# Patient Record
Sex: Female | Born: 1950 | Hispanic: No | Marital: Married | Smoking: Never smoker
Health system: Southern US, Community
[De-identification: ages and names within clinical notes are randomized; demographics above are authoritative.]

## PROBLEM LIST (undated history)

## (undated) DIAGNOSIS — I10 Essential (primary) hypertension: Secondary | ICD-10-CM

## (undated) DIAGNOSIS — D649 Anemia, unspecified: Secondary | ICD-10-CM

## (undated) HISTORY — DX: Essential (primary) hypertension: I10

## (undated) HISTORY — DX: Anemia, unspecified: D64.9

---

## 2013-03-18 ENCOUNTER — Other Ambulatory Visit: Payer: Self-pay | Admitting: Cardiovascular Disease

## 2013-03-18 MED ORDER — PREDNISONE 20 MG PO TABS: 20.0000 mg | ORAL_TABLET | Freq: Every day | ORAL | Status: DC

## 2013-05-03 ENCOUNTER — Other Ambulatory Visit: Payer: Self-pay | Admitting: Cardiovascular Disease

## 2013-05-03 MED ORDER — PREDNISONE 2.5 MG PO TABS: 2.5000 mg | ORAL_TABLET | Freq: Every day | ORAL | Status: DC

## 2013-05-03 MED ORDER — AMOXICILLIN 500 MG PO TABS: 500.0000 mg | ORAL_TABLET | Freq: Three times a day (TID) | ORAL | Status: DC

## 2014-03-11 ENCOUNTER — Other Ambulatory Visit: Payer: Self-pay | Admitting: Cardiovascular Disease

## 2014-03-11 MED ORDER — PREDNISONE 20 MG PO TABS: 20.0000 mg | ORAL_TABLET | Freq: Every day | ORAL | Status: DC

## 2014-05-02 ENCOUNTER — Other Ambulatory Visit: Payer: Self-pay | Admitting: Cardiovascular Disease

## 2014-05-02 MED ORDER — PREDNISONE 2.5 MG PO TABS: 2.5000 mg | ORAL_TABLET | Freq: Every day | ORAL | Status: DC

## 2014-07-30 ENCOUNTER — Other Ambulatory Visit: Payer: Self-pay | Admitting: Cardiovascular Disease

## 2014-07-30 MED ORDER — AMOXICILLIN 500 MG PO TABS: 500.0000 mg | ORAL_TABLET | Freq: Three times a day (TID) | ORAL | Status: DC

## 2015-04-08 ENCOUNTER — Other Ambulatory Visit: Payer: Self-pay | Admitting: Cardiovascular Disease

## 2015-04-08 MED ORDER — AMOXICILLIN 500 MG PO TABS: 500.0000 mg | ORAL_TABLET | Freq: Three times a day (TID) | ORAL | Status: DC

## 2015-04-13 ENCOUNTER — Other Ambulatory Visit: Payer: Self-pay | Admitting: Cardiovascular Disease

## 2015-04-13 MED ORDER — PREDNISONE 20 MG PO TABS: 20.0000 mg | ORAL_TABLET | Freq: Every day | ORAL | Status: DC

## 2015-05-23 ENCOUNTER — Other Ambulatory Visit: Payer: Self-pay | Admitting: Cardiovascular Disease

## 2015-05-23 MED ORDER — LISINOPRIL-HYDROCHLOROTHIAZIDE 20-25 MG PO TABS: 1.0000 | ORAL_TABLET | Freq: Every day | ORAL | Status: DC

## 2015-05-23 MED ORDER — CARVEDILOL 25 MG PO TABS: 25.0000 mg | ORAL_TABLET | Freq: Two times a day (BID) | ORAL | Status: DC

## 2015-06-25 ENCOUNTER — Other Ambulatory Visit: Payer: Self-pay | Admitting: Cardiovascular Disease

## 2015-06-25 MED ORDER — AMOXICILLIN 500 MG PO CAPS: 500.0000 mg | ORAL_CAPSULE | Freq: Three times a day (TID) | ORAL | Status: DC

## 2015-10-13 ENCOUNTER — Other Ambulatory Visit: Payer: Self-pay | Admitting: Nurse Practitioner

## 2015-10-13 ENCOUNTER — Ambulatory Visit
Admission: RE | Admit: 2015-10-13 | Discharge: 2015-10-13 | Disposition: A | Discharge status: Home / Self Care | Payer: Medicaid Other | Source: Ambulatory Visit | Attending: Nurse Practitioner | Admitting: Nurse Practitioner

## 2015-10-13 DIAGNOSIS — M25511 Pain in right shoulder: Secondary | ICD-10-CM | POA: Insufficient documentation

## 2015-10-13 DIAGNOSIS — M542 Cervicalgia: Secondary | ICD-10-CM

## 2015-10-13 DIAGNOSIS — M50322 Other cervical disc degeneration at C5-C6 level: Secondary | ICD-10-CM | POA: Insufficient documentation

## 2015-11-20 ENCOUNTER — Other Ambulatory Visit: Payer: Self-pay | Admitting: Cardiovascular Disease

## 2015-11-20 MED ORDER — LISINOPRIL-HYDROCHLOROTHIAZIDE 20-25 MG PO TABS: 1.0000 | ORAL_TABLET | Freq: Every day | ORAL | Status: DC

## 2016-03-01 ENCOUNTER — Other Ambulatory Visit: Payer: Self-pay | Admitting: Cardiovascular Disease

## 2016-03-01 MED ORDER — PREDNISONE 20 MG PO TABS: 20.0000 mg | ORAL_TABLET | Freq: Every day | ORAL | 0 refills | Status: DC

## 2016-03-18 ENCOUNTER — Other Ambulatory Visit: Payer: Self-pay | Admitting: Internal Medicine

## 2016-03-18 DIAGNOSIS — M5000 Cervical disc disorder with myelopathy, unspecified cervical region: Secondary | ICD-10-CM

## 2016-03-18 DIAGNOSIS — E2839 Other primary ovarian failure: Secondary | ICD-10-CM

## 2016-03-18 DIAGNOSIS — M25511 Pain in right shoulder: Secondary | ICD-10-CM

## 2016-03-20 ENCOUNTER — Ambulatory Visit: Payer: Medicaid Other

## 2016-03-25 ENCOUNTER — Ambulatory Visit: Admission: RE | Admit: 2016-03-25 | Payer: Medicaid Other | Source: Ambulatory Visit

## 2016-04-04 ENCOUNTER — Ambulatory Visit
Admission: RE | Admit: 2016-04-04 | Discharge: 2016-04-04 | Disposition: A | Discharge status: Home / Self Care | Payer: Medicaid Other | Source: Ambulatory Visit | Attending: Internal Medicine | Admitting: Internal Medicine

## 2016-04-04 DIAGNOSIS — M75111 Incomplete rotator cuff tear or rupture of right shoulder, not specified as traumatic: Secondary | ICD-10-CM | POA: Insufficient documentation

## 2016-04-04 DIAGNOSIS — M4802 Spinal stenosis, cervical region: Secondary | ICD-10-CM | POA: Diagnosis not present

## 2016-04-04 DIAGNOSIS — M50322 Other cervical disc degeneration at C5-C6 level: Secondary | ICD-10-CM | POA: Insufficient documentation

## 2016-04-04 DIAGNOSIS — M7581 Other shoulder lesions, right shoulder: Secondary | ICD-10-CM | POA: Insufficient documentation

## 2016-04-04 DIAGNOSIS — M25511 Pain in right shoulder: Secondary | ICD-10-CM | POA: Diagnosis present

## 2016-04-04 DIAGNOSIS — M5000 Cervical disc disorder with myelopathy, unspecified cervical region: Secondary | ICD-10-CM

## 2016-04-04 IMAGING — MR MR SHOULDER*R* W/O CM
5 series · 40 of 40 positions shown · non-contrast
Comparison: None.

CLINICAL DATA: Right shoulder pain

EXAM:
MRI OF THE RIGHT SHOULDER WITHOUT CONTRAST
TECHNIQUE: Multiplanar, multisequence MR imaging of the shoulder was performed.
No intravenous contrast was administered.

[Series 3: T2 fat-sat · axial · 4.0mm · 0.55mm/px · z∈[-15,+73]mm · 9 of 21 slices shown (1 of 3)]
[im 1/21]
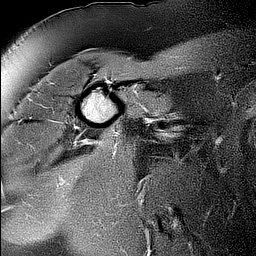
[im 3/21]
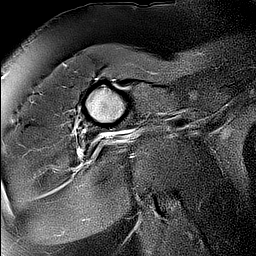
[im 6/21]
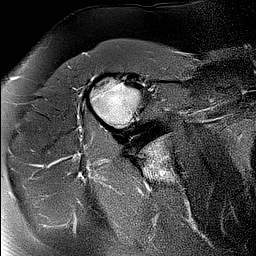
[im 8/21]
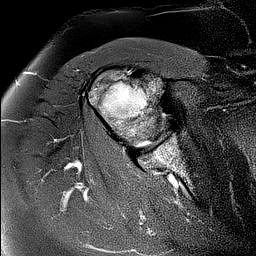
[im 11/21]
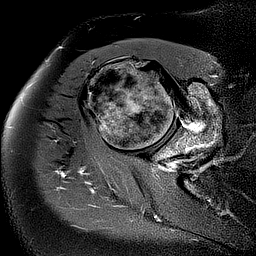
[im 13/21]
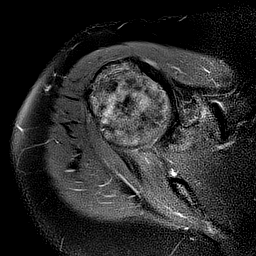
[im 16/21]
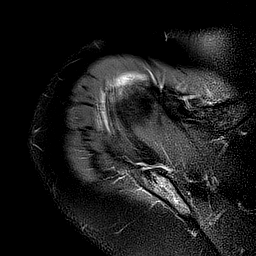
[im 18/21]
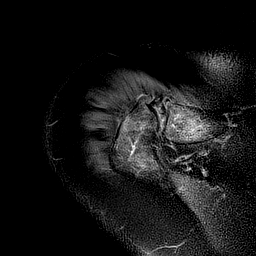
[im 21/21]
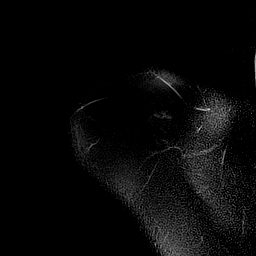

[Series 4: T2 fat-sat · oblique · 4.0mm · 0.62mm/px · 8 of 18 slices shown (2 of 3)]
[im 1/18]
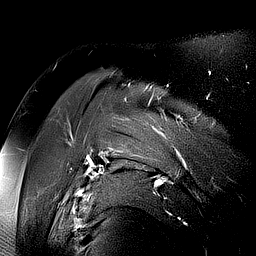
[im 3/18]
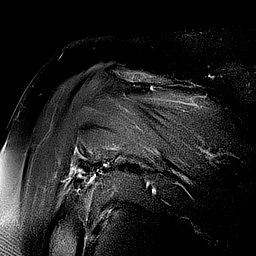
[im 5/18]
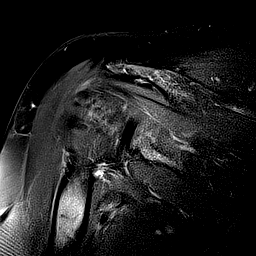
[im 8/18]
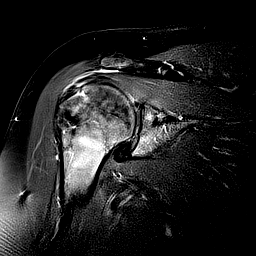
[im 10/18]
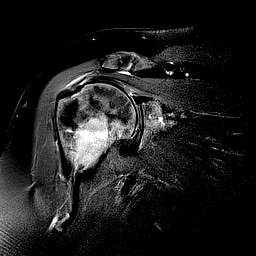
[im 13/18]
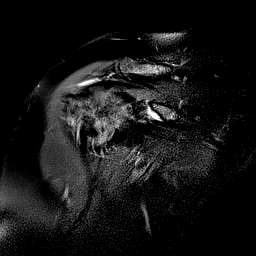
[im 15/18]
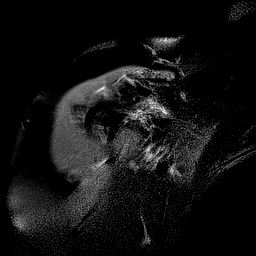
[im 18/18]
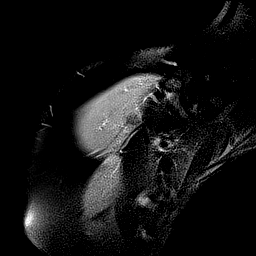

[Series 5: PD · oblique · 4.0mm · 0.62mm/px · 7 of 18 slices shown]
[im 1/18]
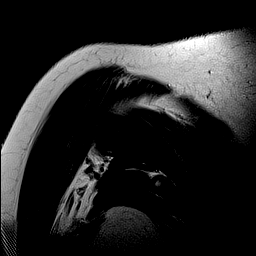
[im 3/18]
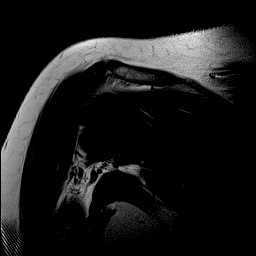
[im 6/18]
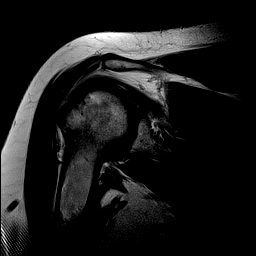
[im 9/18]
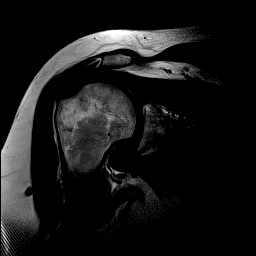
[im 12/18]
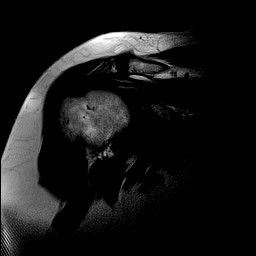
[im 15/18]
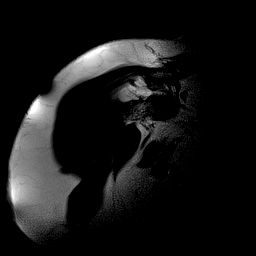
[im 18/18]
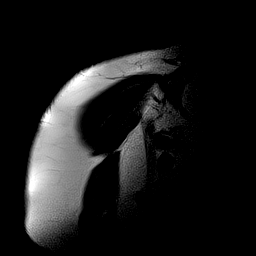

[Series 100: T1 · sagittal · 4.0mm · 0.54mm/px · 8 of 21 slices shown]
[im 1/21]
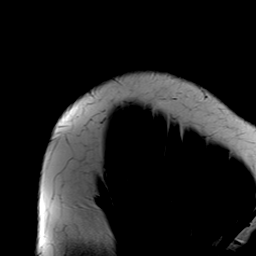
[im 3/21]
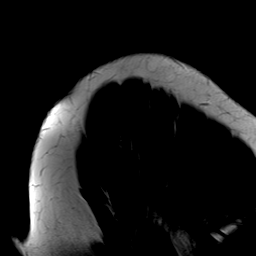
[im 6/21]
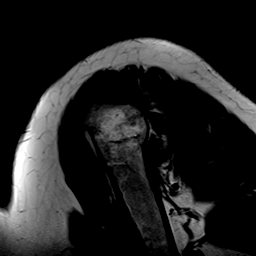
[im 9/21]
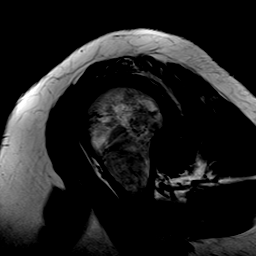
[im 12/21]
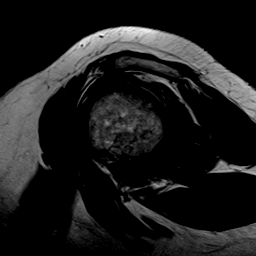
[im 15/21]
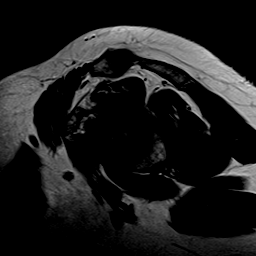
[im 18/21]
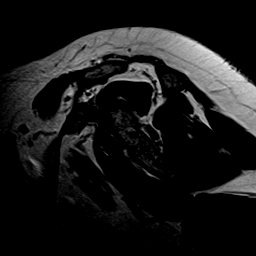
[im 21/21]
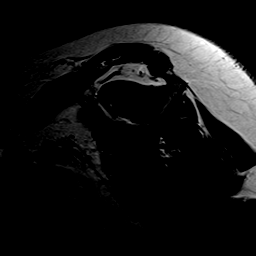

[Series 101: T2 fat-sat · sagittal · 4.0mm · 0.54mm/px · 8 of 21 slices shown (3 of 3)]
[im 1/21]
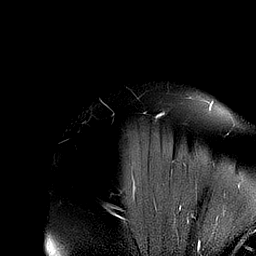
[im 3/21]
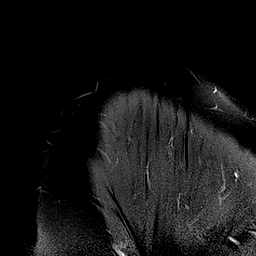
[im 6/21]
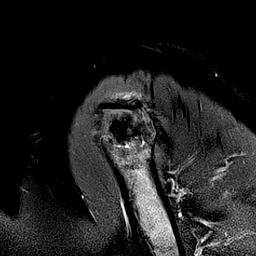
[im 9/21]
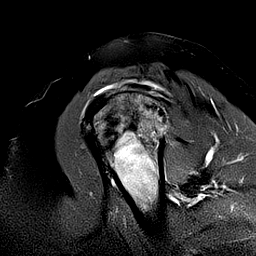
[im 12/21]
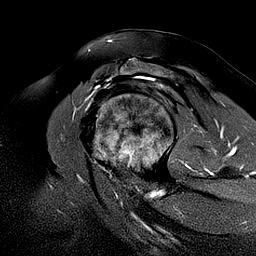
[im 15/21]
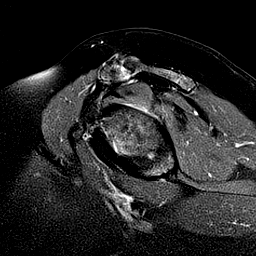
[im 18/21]
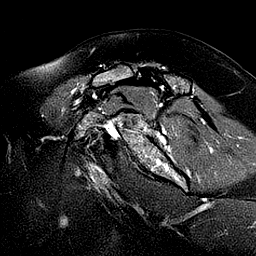
[im 21/21]
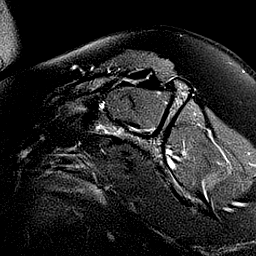

[40 of 40 positions shown; findings below may reference images not displayed]

FINDINGS: Rotator cuff: Moderate tendinosis of the supraspinatus tendon with a
partial-thickness bursal surface tear of the anterior fibers. Mild
tendinosis of the infraspinatus tendon. Teres minor tendon is
intact. Subscapularis tendon is intact.

Muscles: No atrophy or fatty replacement of nor abnormal signal
within, the muscles of the rotator cuff.

Biceps long head: Mild tendinosis of the intraarticular portion of
the long head of the biceps tendon.

Acromioclavicular Joint: Mild degenerative changes of the
acromioclavicular joint. Type I acromion. Trace subacromial/
subdeltoid bursal fluid.

Glenohumeral Joint: No joint effusion. Mild partial thickness
cartilage loss.

Labrum: Grossly intact, but evaluation is limited by lack of
intraarticular fluid.

Bones:  No marrow signal abnormality.  No fracture or dislocation.

Other: No fluid collection or hematoma.
IMPRESSION: 1. Moderate tendinosis of the supraspinatus tendon with a
partial-thickness bursal surface tear of the anterior fibers.
2. Mild tendinosis of the infraspinatus tendon.
3. Mild tendinosis of the intraarticular portion of the long head of
the biceps tendon.

## 2016-05-06 ENCOUNTER — Ambulatory Visit
Admission: RE | Admit: 2016-05-06 | Discharge: 2016-05-06 | Disposition: A | Discharge status: Home / Self Care | Payer: Medicaid Other | Source: Ambulatory Visit | Attending: Internal Medicine | Admitting: Internal Medicine

## 2016-05-06 DIAGNOSIS — E2839 Other primary ovarian failure: Secondary | ICD-10-CM | POA: Insufficient documentation

## 2016-05-06 DIAGNOSIS — M8588 Other specified disorders of bone density and structure, other site: Secondary | ICD-10-CM | POA: Diagnosis not present

## 2017-04-28 ENCOUNTER — Other Ambulatory Visit: Payer: Self-pay | Admitting: Cardiovascular Disease

## 2017-04-28 MED ORDER — CARVEDILOL 6.25 MG PO TABS: 6.2500 mg | ORAL_TABLET | Freq: Two times a day (BID) | ORAL | 0 refills | Status: DC

## 2017-05-25 ENCOUNTER — Other Ambulatory Visit: Payer: Self-pay | Admitting: Cardiovascular Disease

## 2017-05-25 MED ORDER — CARVEDILOL 12.5 MG PO TABS: 12.5000 mg | ORAL_TABLET | Freq: Two times a day (BID) | ORAL | 2 refills | Status: DC

## 2017-08-19 ENCOUNTER — Other Ambulatory Visit: Payer: Self-pay | Admitting: Cardiovascular Disease

## 2017-08-19 MED ORDER — CARVEDILOL 12.5 MG PO TABS: 12.5000 mg | ORAL_TABLET | Freq: Two times a day (BID) | ORAL | 2 refills | Status: DC

## 2017-08-21 ENCOUNTER — Other Ambulatory Visit: Payer: Self-pay | Admitting: Cardiovascular Disease

## 2017-08-21 MED ORDER — AZITHROMYCIN 250 MG PO TABS: ORAL_TABLET | ORAL | 0 refills | Status: DC

## 2017-09-10 ENCOUNTER — Other Ambulatory Visit: Payer: Self-pay | Admitting: Cardiovascular Disease

## 2017-09-10 MED ORDER — PREDNISONE 20 MG PO TABS: 20.0000 mg | ORAL_TABLET | Freq: Every day | ORAL | 0 refills | Status: DC

## 2018-10-17 ENCOUNTER — Other Ambulatory Visit: Payer: Self-pay | Admitting: Cardiovascular Disease

## 2018-10-17 MED ORDER — CARVEDILOL 12.5 MG PO TABS: 12.5000 mg | ORAL_TABLET | Freq: Two times a day (BID) | ORAL | 3 refills | Status: DC

## 2019-09-23 ENCOUNTER — Telehealth: Payer: Self-pay

## 2019-09-23 ENCOUNTER — Other Ambulatory Visit: Payer: Self-pay

## 2019-09-23 ENCOUNTER — Ambulatory Visit: Payer: Medicaid Other | Admitting: Nurse Practitioner

## 2019-09-23 ENCOUNTER — Encounter: Payer: Self-pay | Admitting: Nurse Practitioner

## 2019-09-23 VITALS — BP 118/78 | HR 84 | Temp 97.9°F | Resp 16 | Ht 65.0 in | Wt 241.0 lb

## 2019-09-23 DIAGNOSIS — I10 Essential (primary) hypertension: Secondary | ICD-10-CM | POA: Diagnosis not present

## 2019-09-23 DIAGNOSIS — D485 Neoplasm of uncertain behavior of skin: Secondary | ICD-10-CM

## 2019-09-23 DIAGNOSIS — Z1211 Encounter for screening for malignant neoplasm of colon: Secondary | ICD-10-CM

## 2019-09-23 DIAGNOSIS — D569 Thalassemia, unspecified: Secondary | ICD-10-CM

## 2019-09-23 NOTE — Telephone Encounter (Signed)
Faxed cologuard 

## 2019-09-23 NOTE — Progress Notes (Signed)
[facility] [place], [place] [address]  Internal MEDICINE  Office Visit Note  Patient Name: Faith Hernandez  [id number]  [id number]  Date of Service: 09/27/2019  Chief Complaint  Patient presents with  . Follow-up    skin Lesion right side of her face   . Hypertension  . Quality Metric Gaps    cologuard  and pneumonia    The patient is here for follow up visit. She presents with her son who is translating for [place]. She complains of small lesion to right side of the face. Has been present for about a month. She state that it seems to be getting smaller. Does not hurt or itch. There is no bleeding or scabbing present. Concern is that there may be squamous cell carcinoma present.  She has no other concerns or complaints. Blood pressure is well managed. 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 She is doe to have routine, fasting labs done. She prefers to do cologuard for colon cancer screening. She has had both COVID 19 vaccines. Patient's son will call back to give [place] dates so these can be recorded in her immunization record.       Current Medication: Outpatient Encounter Medications as of 09/23/2019  Medication Sig  . pneumococcal 13-valent conjugate vaccine (PREVNAR 13) SUSP injection Inject 0.5 mLs into the muscle tomorrow at 10 am.  . [DISCONTINUED] amoxicillin (AMOXIL) 500 MG capsule Take 1 capsule (500 mg total) by mouth 3 (three) times daily.  . [DISCONTINUED] azithromycin (ZITHROMAX) 250 MG tablet Take 2 tablet first day then 1 tablet daily.  . [DISCONTINUED] predniSONE (DELTASONE) 20 MG tablet Take 1 tablet (20 mg total) by mouth daily.  . carvedilol (COREG) 12.5 MG tablet Take 1 tablet (12.5 mg total) by mouth 2 (two) times daily.   No facility-administered encounter medications on file as of 09/23/2019.    Surgical History: History reviewed. No pertinent surgical history.  Medical History: Past Medical History:    Diagnosis Date  . Anemia   . Hypertension     Family History: Family History  Problem Relation Age of Onset  . Hypertension Brother   . Hyperlipidemia Brother     Social History   Socioeconomic History  . Marital status: Married    Spouse name: Not on file  . Number of children: Not on file  . Years of education: Not on file  . Highest education level: Not on file  Occupational History  . Not on file  Tobacco Use  . Smoking status: Never Smoker  . Smokeless tobacco: Never Used  Substance and Sexual Activity  . Alcohol use: Not on file  . Drug use: Not on file  . Sexual activity: Not on file  Other Topics Concern  . Not on file  Social History Narrative  . Not on file   Social Determinants of Health   Financial Resource Strain:   . Difficulty of Paying Living Expenses:   Food Insecurity:   . Worried About Charity fundraiser in the Last Year:   . Arboriculturist in the Last Year:   Transportation Needs:   . Film/video editor (Medical):   Marland Kitchen Lack of Transportation (Non-Medical):   Physical Activity:   . Days of Exercise per Week:   . Minutes of Exercise per Session:   Stress:   . Feeling of Stress :   Social Connections:   . Frequency of Communication with Friends and Family:   .  Frequency of Social Gatherings with Friends and Family:   . Attends Religious Services:   . Active Member of Clubs or Organizations:   . Attends Archivist Meetings:   Marland Kitchen Marital Status:   Intimate Partner Violence:   . Fear of Current or Ex-Partner:   . Emotionally Abused:   Marland Kitchen Physically Abused:   . Sexually Abused:       Review of Systems  Constitutional: Negative for chills, fatigue and unexpected weight change.  HENT: Positive for postnasal drip. Negative for congestion, rhinorrhea, sneezing and sore throat.   Respiratory: Negative for cough, chest tightness and shortness of breath.   Cardiovascular: Negative for chest pain and palpitations.   Gastrointestinal: Negative for abdominal pain, constipation, diarrhea, nausea and vomiting.  Endocrine: Negative for cold intolerance, heat intolerance, polydipsia and polyuria.  Musculoskeletal: Negative for arthralgias, back pain, joint swelling and neck pain.  Skin: Negative for rash.       Skin lesion to right cheek which is itchy and has been present for a few weeks.   Neurological: Negative.  Negative for tremors and numbness.  Hematological: Negative for adenopathy. Does not bruise/bleed easily.  Psychiatric/Behavioral: Negative for behavioral problems (Depression), sleep disturbance and suicidal ideas. The patient is not nervous/anxious.     Today's Vitals   09/23/19 1343  BP: 118/78  Pulse: 84  Resp: 16  Temp: 97.9 F (36.6 C)  SpO2: 98%  Weight: 241 lb (109.3 kg)  Height: 5\' 5"  (1.651 m)   Body mass index is 40.1 kg/m.  Physical Exam Vitals and nursing note reviewed.  Constitutional:      General: She is not in acute distress.    Appearance: Normal appearance. She is well-developed. She is not diaphoretic.  HENT:     Head: Normocephalic and atraumatic.     Nose: Nose normal.     Mouth/Throat:     Pharynx: No oropharyngeal exudate.  Eyes:     Pupils: Pupils are equal, round, and reactive to light.  Neck:     Thyroid: No thyromegaly.     Vascular: No carotid bruit or JVD.     Trachea: No tracheal deviation.  Cardiovascular:     Rate and Rhythm: Normal rate and regular rhythm.     Heart sounds: Normal heart sounds. No murmur. No friction rub. No gallop.   Pulmonary:     Effort: Pulmonary effort is normal. No respiratory distress.     Breath sounds: Normal breath sounds. No wheezing or rales.  Chest:     Chest wall: No tenderness.  Abdominal:     Palpations: Abdomen is soft.  Musculoskeletal:        General: Normal range of motion.     Cervical back: Normal range of motion and neck supple.  Lymphadenopathy:     Cervical: No cervical adenopathy.  Skin:     General: Skin is warm and dry.       Neurological:     Mental Status: She is alert and oriented to person, place, and time.     Cranial Nerves: No cranial nerve deficit.  Psychiatric:        Behavior: Behavior normal.        Thought Content: Thought content normal.        Judgment: Judgment normal.   Assessment/Plan: 1. Essential hypertension Stable. Continue bp medication as prescribed   2. Thalassemia, unspecified type Check labs for surveillance.   3. Neoplasm of uncertain behavior of skin of face Refer to  dermatology for further evaluation.  - Ambulatory referral to Dermatology  4. Screening for colon cancer Order for colocguard to be sent.   General Counseling: Jaynie verbalizes understanding of the findings of todays visit and agrees with plan of treatment. I have discussed any further diagnostic evaluation that may be needed or ordered today. We also reviewed her medications today. she has been encouraged to call the office with any questions or concerns that should arise related to todays visit.  This patient was seen by Leretha Pol FNP Collaboration with Dr Lavera Guise as a part of collaborative care agreement  Orders Placed This Encounter  Procedures  . Ambulatory referral to Dermatology      Total time spent: 30 Minutes  Time spent includes review of chart, medications, test results, and follow up plan with the patient.      Dr Lavera Guise Internal medicine

## 2019-09-27 DIAGNOSIS — I10 Essential (primary) hypertension: Secondary | ICD-10-CM | POA: Insufficient documentation

## 2019-09-27 DIAGNOSIS — Z1211 Encounter for screening for malignant neoplasm of colon: Secondary | ICD-10-CM | POA: Insufficient documentation

## 2019-09-27 DIAGNOSIS — D485 Neoplasm of uncertain behavior of skin: Secondary | ICD-10-CM | POA: Insufficient documentation

## 2019-09-27 DIAGNOSIS — D569 Thalassemia, unspecified: Secondary | ICD-10-CM | POA: Insufficient documentation

## 2019-10-21 LAB — COLOGUARD: COLOGUARD: NEGATIVE

## 2019-11-02 ENCOUNTER — Other Ambulatory Visit: Payer: Self-pay | Admitting: Nurse Practitioner

## 2019-11-02 DIAGNOSIS — I10 Essential (primary) hypertension: Secondary | ICD-10-CM

## 2019-11-02 MED ORDER — CARVEDILOL 12.5 MG PO TABS: 12.5000 mg | ORAL_TABLET | Freq: Two times a day (BID) | ORAL | 3 refills | Status: DC

## 2019-11-02 NOTE — Progress Notes (Signed)
Renewed carvedilol and sent to [facility] [address].

## 2020-01-05 ENCOUNTER — Ambulatory Visit: Payer: Self-pay | Admitting: Dermatology

## 2020-02-12 ENCOUNTER — Other Ambulatory Visit: Payer: Self-pay | Admitting: Cardiovascular Disease

## 2020-02-12 MED ORDER — PREDNISONE 20 MG PO TABS: 20.0000 mg | ORAL_TABLET | Freq: Every day | ORAL | 0 refills | Status: DC

## 2020-11-08 ENCOUNTER — Other Ambulatory Visit: Payer: Self-pay | Admitting: Nurse Practitioner

## 2020-11-08 DIAGNOSIS — I10 Essential (primary) hypertension: Secondary | ICD-10-CM

## 2020-11-20 ENCOUNTER — Other Ambulatory Visit: Payer: Self-pay | Admitting: Internal Medicine

## 2020-11-20 DIAGNOSIS — I10 Essential (primary) hypertension: Secondary | ICD-10-CM

## 2020-11-20 MED ORDER — CARVEDILOL 12.5 MG PO TABS
12.5000 mg | ORAL_TABLET | Freq: Two times a day (BID) | ORAL | 3 refills | Status: DC
Start: 2020-11-20 — End: 2021-12-03

## 2021-12-03 ENCOUNTER — Other Ambulatory Visit: Payer: Self-pay | Admitting: Internal Medicine

## 2021-12-03 ENCOUNTER — Telehealth: Payer: Self-pay

## 2021-12-03 DIAGNOSIS — I10 Essential (primary) hypertension: Secondary | ICD-10-CM

## 2021-12-03 MED ORDER — CARVEDILOL 12.5 MG PO TABS: 12.5000 mg | ORAL_TABLET | Freq: Two times a day (BID) | ORAL | 3 refills | Status: DC

## 2021-12-06 NOTE — Telephone Encounter (Signed)
error 

## 2022-01-10 ENCOUNTER — Telehealth: Payer: Self-pay

## 2022-01-10 NOTE — Telephone Encounter (Signed)
Left vm to confirm 01/17/22 appointment-Toni

## 2022-01-17 ENCOUNTER — Ambulatory Visit: Payer: Medicaid Other | Admitting: Physician Assistant

## 2022-01-17 DIAGNOSIS — R946 Abnormal results of thyroid function studies: Secondary | ICD-10-CM

## 2022-01-17 DIAGNOSIS — E538 Deficiency of other specified B group vitamins: Secondary | ICD-10-CM

## 2022-01-17 DIAGNOSIS — I10 Essential (primary) hypertension: Secondary | ICD-10-CM | POA: Diagnosis not present

## 2022-01-17 DIAGNOSIS — E782 Mixed hyperlipidemia: Secondary | ICD-10-CM

## 2022-01-17 DIAGNOSIS — E559 Vitamin D deficiency, unspecified: Secondary | ICD-10-CM

## 2022-01-17 DIAGNOSIS — R5383 Other fatigue: Secondary | ICD-10-CM

## 2022-01-17 NOTE — Progress Notes (Signed)
[facility] [place], [place] [address]  Internal MEDICINE  Office Visit Note  Patient Name: Faith Hernandez  [id number]  [id number]  Date of Service: 01/24/2022  Chief Complaint  Patient presents with   Hypertension   Quality Metric Gaps    AWV    HPI Pt is here for routine follow up with her son -She is taking carvedilol as well as an OTC vitamin D supplement -Checks BP at home sometimes and has been good -She has no acute concerns today, just needs refills and would like to be set up for annual wellness with labs -Will go ahead and order labs to be done while fasting  Current Medication: Outpatient Encounter Medications as of 01/17/2022  Medication Sig   carvedilol (COREG) 12.5 MG tablet Take 1 tablet (12.5 mg total) by mouth 2 (two) times daily.   [DISCONTINUED] pneumococcal 13-valent conjugate vaccine (PREVNAR 13) SUSP injection Inject 0.5 mLs into the muscle tomorrow at 10 am.   [DISCONTINUED] predniSONE (DELTASONE) 20 MG tablet Take 1 tablet (20 mg total) by mouth daily with breakfast.   No facility-administered encounter medications on file as of 01/17/2022.    Surgical History: No past surgical history on file.  Medical History: Past Medical History:  Diagnosis Date   Anemia    Hypertension     Family History: Family History  Problem Relation Age of Onset   Hypertension Brother    Hyperlipidemia Brother     Social History   Socioeconomic History   Marital status: Married    Spouse name: Not on file   Number of children: Not on file   Years of education: Not on file   Highest education level: Not on file  Occupational History   Not on file  Tobacco Use   Smoking status: Never   Smokeless tobacco: Never  Substance and Sexual Activity   Alcohol use: Not on file   Drug use: Not on file   Sexual activity: Not on file  Other Topics Concern   Not on file  Social History Narrative   Not on file   Social Determinants of  Health   Financial Resource Strain: Not on file  Food Insecurity: Not on file  Transportation Needs: Not on file  Physical Activity: Not on file  Stress: Not on file  Social Connections: Not on file  Intimate Partner Violence: Not on file      Review of Systems  Constitutional:  Negative for chills, fatigue and unexpected weight change.  HENT:  Negative for congestion, rhinorrhea, sneezing and sore throat.   Eyes:  Negative for redness.  Respiratory:  Negative for cough, chest tightness and shortness of breath.   Cardiovascular:  Negative for chest pain and palpitations.  Gastrointestinal:  Negative for abdominal pain, constipation, diarrhea, nausea and vomiting.  Genitourinary:  Negative for dysuria and frequency.  Musculoskeletal:  Negative for arthralgias, back pain, joint swelling and neck pain.  Skin:  Negative for rash.  Neurological: Negative.  Negative for tremors and numbness.  Hematological:  Negative for adenopathy. Does not bruise/bleed easily.  Psychiatric/Behavioral:  Negative for behavioral problems (Depression), sleep disturbance and suicidal ideas. The patient is not nervous/anxious.     Vital Signs: BP 132/84   Pulse 73   Temp 97.8 F (36.6 C)   Resp 16   Ht '5\' 5"'$  (1.651 m)   Wt 243 lb (110.2 kg)   SpO2 97%   BMI 40.44 kg/m    Physical Exam Vitals and  nursing note reviewed.  Constitutional:      General: She is not in acute distress.    Appearance: Normal appearance. She is well-developed. She is not diaphoretic.  HENT:     Head: Normocephalic and atraumatic.     Mouth/Throat:     Pharynx: No oropharyngeal exudate.  Eyes:     Pupils: Pupils are equal, round, and reactive to light.  Neck:     Thyroid: No thyromegaly.     Vascular: No JVD.     Trachea: No tracheal deviation.  Cardiovascular:     Rate and Rhythm: Normal rate and regular rhythm.     Heart sounds: Normal heart sounds. No murmur heard.    No friction rub. No gallop.  Pulmonary:      Effort: Pulmonary effort is normal. No respiratory distress.     Breath sounds: No wheezing or rales.  Chest:     Chest wall: No tenderness.  Abdominal:     General: Bowel sounds are normal.     Palpations: Abdomen is soft.  Musculoskeletal:        General: Normal range of motion.     Cervical back: Normal range of motion and neck supple.  Lymphadenopathy:     Cervical: No cervical adenopathy.  Skin:    General: Skin is warm and dry.  Neurological:     Mental Status: She is alert and oriented to person, place, and time.     Cranial Nerves: No cranial nerve deficit.  Psychiatric:        Behavior: Behavior normal.        Thought Content: Thought content normal.        Judgment: Judgment normal.        Assessment/Plan: 1. Essential hypertension Stable, continue current medications  2. Vitamin D deficiency - VITAMIN D 25 Hydroxy (Vit-D Deficiency, Fractures)  3. B12 deficiency - B12 and Folate Panel  4. Abnormal thyroid exam - TSH + free T4  5. Mixed hyperlipidemia - Lipid Panel With LDL/HDL Ratio  6. Other fatigue - CBC w/Diff/Platelet - Comprehensive metabolic panel - Lipid Panel With LDL/HDL Ratio - TSH + free T4 - VITAMIN D 25 Hydroxy (Vit-D Deficiency, Fractures) - B12 and Folate Panel - Fe+TIBC+Fer   General Counseling: [name] verbalizes understanding of the findings of todays visit and agrees with plan of treatment. I have discussed any further diagnostic evaluation that may be needed or ordered today. We also reviewed her medications today. she has been encouraged to call the office with any questions or concerns that should arise related to todays visit.    Orders Placed This Encounter  Procedures   CBC w/Diff/Platelet   Comprehensive metabolic panel   Lipid Panel With LDL/HDL Ratio   TSH + free T4   VITAMIN D 25 Hydroxy (Vit-D Deficiency, Fractures)   B12 and Folate Panel   Fe+TIBC+Fer    No orders of the defined types were placed in this  encounter.   This patient was seen by Drema Dallas, PA-C in collaboration with Dr. Clayborn Bigness as a part of collaborative care agreement.   Total time spent:30 Minutes Time spent includes review of chart, medications, test results, and follow up plan with the patient.      Dr Lavera Guise Internal medicine

## 2022-01-19 LAB — LIPID PANEL WITH LDL/HDL RATIO
Cholesterol, Total: 154 mg/dL (ref 100–199)
HDL: 42 mg/dL (ref >39)
LDL Chol Calc (NIH): 85 mg/dL (ref 0–99)
LDL/HDL Ratio: 2 ratio (ref 0.0–3.2)
Triglycerides: 156 mg/dL — ABNORMAL HIGH (ref 0–149)
VLDL Cholesterol Cal: 27 mg/dL (ref 5–40)

## 2022-01-19 LAB — B12 AND FOLATE PANEL
Folate: 7.4 ng/mL (ref >3.0)
Vitamin B-12: 196 pg/mL — ABNORMAL LOW (ref 232–1245)

## 2022-01-19 LAB — TSH+FREE T4
Free T4: 1.13 ng/dL (ref 0.82–1.77)
TSH: 3.7 u[IU]/mL (ref 0.450–4.500)

## 2022-01-19 LAB — CBC WITH DIFFERENTIAL/PLATELET
Basophils Absolute: 0 10*3/uL (ref 0.0–0.2)
Basos: 0 %
EOS (ABSOLUTE): 0.2 10*3/uL (ref 0.0–0.4)
Eos: 3 %
Hematocrit: 35.3 % (ref 34.0–46.6)
Hemoglobin: 10.8 g/dL — ABNORMAL LOW (ref 11.1–15.9)
Immature Grans (Abs): 0 10*3/uL (ref 0.0–0.1)
Immature Granulocytes: 0 %
Lymphocytes Absolute: 2.4 10*3/uL (ref 0.7–3.1)
Lymphs: 33 %
MCH: 19.1 pg — ABNORMAL LOW (ref 26.6–33.0)
MCHC: 30.6 g/dL — ABNORMAL LOW (ref 31.5–35.7)
MCV: 63 fL — ABNORMAL LOW (ref 79–97)
Monocytes Absolute: 0.5 10*3/uL (ref 0.1–0.9)
Monocytes: 7 %
Neutrophils Absolute: 4.2 10*3/uL (ref 1.4–7.0)
Neutrophils: 57 %
Platelets: 226 10*3/uL (ref 150–450)
RBC: 5.65 x10E6/uL — ABNORMAL HIGH (ref 3.77–5.28)
RDW: 17.5 % — ABNORMAL HIGH (ref 11.7–15.4)
WBC: 7.4 10*3/uL (ref 3.4–10.8)

## 2022-01-19 LAB — COMPREHENSIVE METABOLIC PANEL
ALT: 23 IU/L (ref 0–32)
AST: 25 IU/L (ref 0–40)
Albumin/Globulin Ratio: 1.7 (ref 1.2–2.2)
Albumin: 4.5 g/dL (ref 3.8–4.8)
Alkaline Phosphatase: 77 IU/L (ref 44–121)
BUN/Creatinine Ratio: 20 (ref 12–28)
BUN: 14 mg/dL (ref 8–27)
Bilirubin Total: 1.7 mg/dL — ABNORMAL HIGH (ref 0.0–1.2)
CO2: 21 mmol/L (ref 20–29)
Calcium: 9.5 mg/dL (ref 8.7–10.3)
Chloride: 98 mmol/L (ref 96–106)
Creatinine, Ser: 0.7 mg/dL (ref 0.57–1.00)
Globulin, Total: 2.6 g/dL (ref 1.5–4.5)
Glucose: 102 mg/dL — ABNORMAL HIGH (ref 70–99)
Potassium: 4.5 mmol/L (ref 3.5–5.2)
Sodium: 135 mmol/L (ref 134–144)
Total Protein: 7.1 g/dL (ref 6.0–8.5)
eGFR: 92 mL/min/{1.73_m2} (ref >59)

## 2022-01-19 LAB — IRON,TIBC AND FERRITIN PANEL
Ferritin: 29 ng/mL (ref 15–150)
Iron Saturation: 24 % (ref 15–55)
Iron: 87 ug/dL (ref 27–139)
Total Iron Binding Capacity: 364 ug/dL (ref 250–450)
UIBC: 277 ug/dL (ref 118–369)

## 2022-01-19 LAB — VITAMIN D 25 HYDROXY (VIT D DEFICIENCY, FRACTURES): Vit D, 25-Hydroxy: 35.4 ng/mL (ref 30.0–100.0)

## 2022-01-22 ENCOUNTER — Telehealth: Payer: Self-pay

## 2022-01-22 NOTE — Telephone Encounter (Signed)
-----   Message from Mylinda Latina, PA-C sent at 01/22/2022  1:51 PM EDT ----- Please let her know that her hemoglobin is slightly low and ask if any blood seen in stool. Her glucose is borderline elevated. Her TG is slightly elevated but overall cholesterol looks good. Her B12 is low and recommend she start b12 injections. Her vit D is also on the low end of normal

## 2022-01-22 NOTE — Telephone Encounter (Signed)
LMOM about lab results and advised that pt make nurse visit appt for vit B12 injections once a week for 3 weeks, then once a month and also low Vit D and can take OTC Vit D 1000 IU.  Advised to call office if any questions

## 2022-04-04 ENCOUNTER — Ambulatory Visit (INDEPENDENT_AMBULATORY_CARE_PROVIDER_SITE_OTHER): Payer: Medicaid Other | Admitting: Physician Assistant

## 2022-04-04 ENCOUNTER — Telehealth: Payer: Self-pay | Admitting: Physician Assistant

## 2022-04-04 ENCOUNTER — Encounter: Payer: Self-pay | Admitting: Physician Assistant

## 2022-04-04 VITALS — BP 130/76 | HR 75 | Temp 97.5°F | Resp 16 | Ht 65.0 in | Wt 241.6 lb

## 2022-04-04 DIAGNOSIS — I10 Essential (primary) hypertension: Secondary | ICD-10-CM

## 2022-04-04 DIAGNOSIS — Z01419 Encounter for gynecological examination (general) (routine) without abnormal findings: Secondary | ICD-10-CM

## 2022-04-04 DIAGNOSIS — Z0001 Encounter for general adult medical examination with abnormal findings: Secondary | ICD-10-CM

## 2022-04-04 DIAGNOSIS — Z23 Encounter for immunization: Secondary | ICD-10-CM | POA: Diagnosis not present

## 2022-04-04 DIAGNOSIS — R7301 Impaired fasting glucose: Secondary | ICD-10-CM

## 2022-04-04 DIAGNOSIS — Z1211 Encounter for screening for malignant neoplasm of colon: Secondary | ICD-10-CM

## 2022-04-04 DIAGNOSIS — E538 Deficiency of other specified B group vitamins: Secondary | ICD-10-CM

## 2022-04-04 DIAGNOSIS — R300 Dysuria: Secondary | ICD-10-CM

## 2022-04-04 DIAGNOSIS — Z1212 Encounter for screening for malignant neoplasm of rectum: Secondary | ICD-10-CM

## 2022-04-04 LAB — POCT GLYCOSYLATED HEMOGLOBIN (HGB A1C): Hemoglobin A1C: 6.1 % — AB (ref 4.0–5.6)

## 2022-04-04 MED ORDER — SHINGRIX 50 MCG/0.5ML IM SUSR: 0.5000 mL | Freq: Once | INTRAMUSCULAR | 0 refills | Status: AC

## 2022-04-04 MED ORDER — PREVNAR 20 0.5 ML IM SUSY: 0.5000 mL | PREFILLED_SYRINGE | INTRAMUSCULAR | 0 refills | Status: AC

## 2022-04-04 NOTE — Telephone Encounter (Signed)
Lvm with son to schedule today's 3 month follow up-Toni

## 2022-04-04 NOTE — Progress Notes (Signed)
[facility] [place], [place] [address]  Internal MEDICINE  Office Visit Note  Patient Name: Faith Hernandez  [id number]  [id number]  Date of Service: 04/09/2022  Chief Complaint  Patient presents with   Annual Exam   Hypertension     HPI Pt is here for routine health maintenance examination with her husband. 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  -Her B12 had also been low but she did not want to do injections and instead is doing a daily oral supplement. -Her glucose was a little elevated as well so an A1c was checked in office and found to be prediabetic, she will work on diet and exercise and will monitor -She is otherwise doing well and has no concerns today -sleeping well  -Good appetite -Walking daily -Got her Flu shot last week -She is due for Shingrix, and prevnar and will send orders to pharmacy -tdap date unknown, but thinks it was within 10years -Due for colon screening and prefers to do Cologuard again -Not interested in mammogram  Current Medication: Outpatient Encounter Medications as of 04/04/2022  Medication Sig   carvedilol (COREG) 12.5 MG tablet Take 1 tablet (12.5 mg total) by mouth 2 (two) times daily.   [DISCONTINUED] pneumococcal 20-valent conjugate vaccine (PREVNAR 20) 0.5 ML injection Inject 0.5 mLs into the muscle tomorrow at 10 am.   [DISCONTINUED] Zoster Vaccine Adjuvanted [facility]) injection Inject 0.5 mLs into the muscle once.   [EXPIRED] pneumococcal 20-valent conjugate vaccine (PREVNAR 20) 0.5 ML injection Inject 0.5 mLs into the muscle tomorrow at 10 am for 1 dose.   [EXPIRED] Zoster Vaccine Adjuvanted [facility]) injection Inject 0.5 mLs into the muscle once for 1 dose.   No facility-administered encounter medications on  file as of 04/04/2022.    Surgical History: History reviewed. No pertinent surgical history.  Medical History: Past Medical History:  Diagnosis Date   Anemia    Hypertension     Family History: Family History  Problem Relation Age of Onset   Hypertension Brother    Hyperlipidemia Brother       Review of Systems  Constitutional:  Negative for chills, fatigue and unexpected weight change.  HENT:  Negative for congestion, rhinorrhea, sneezing and sore throat.   Eyes:  Negative for redness.  Respiratory:  Negative for cough, chest tightness and shortness of breath.   Cardiovascular:  Negative for chest pain and palpitations.  Gastrointestinal:  Negative for abdominal pain, constipation, diarrhea, nausea and vomiting.  Genitourinary:  Negative for dysuria and frequency.  Musculoskeletal:  Negative for arthralgias, back pain, joint swelling and neck pain.  Skin:  Negative for rash.  Neurological: Negative.  Negative for tremors and numbness.  Hematological:  Negative for adenopathy. Does not bruise/bleed easily.  Psychiatric/Behavioral:  Negative for behavioral problems (Depression), sleep disturbance and suicidal ideas. The patient is not nervous/anxious.      Vital Signs: BP 130/76 Comment: 140/81  Pulse 75   Temp (!) 97.5 F (36.4 C)   Resp 16   Ht _0  (1.651 m)   Wt 241 lb 9.6 oz (109.6 kg)   SpO2 96%   BMI 40.20 kg/m    Physical Exam Vitals and nursing note reviewed.  Constitutional:      General: She is not in acute  distress.    Appearance: Normal appearance. She is well-developed. She is not diaphoretic.  HENT:     Head: Normocephalic and atraumatic.     Mouth/Throat:     Pharynx: No oropharyngeal exudate.  Eyes:     Pupils: Pupils are equal, round, and reactive to light.  Neck:     Thyroid: No thyromegaly.     Vascular: No JVD.     Trachea: No tracheal deviation.  Cardiovascular:     Rate and Rhythm: Normal rate and regular rhythm.     Heart  sounds: Normal heart sounds. No murmur heard.    No friction rub. No gallop.  Pulmonary:     Effort: Pulmonary effort is normal. No respiratory distress.     Breath sounds: No wheezing or rales.  Chest:     Chest wall: No tenderness.  Breasts:    Right: Normal. No mass.     Left: Normal. No mass.  Abdominal:     General: Bowel sounds are normal.     Palpations: Abdomen is soft.     Tenderness: There is no abdominal tenderness.  Musculoskeletal:        General: Normal range of motion.     Cervical back: Normal range of motion and neck supple.  Lymphadenopathy:     Cervical: No cervical adenopathy.  Skin:    General: Skin is warm and dry.  Neurological:     Mental Status: She is alert and oriented to person, place, and time.     Cranial Nerves: No cranial nerve deficit.  Psychiatric:        Behavior: Behavior normal.        Thought Content: Thought content normal.        Judgment: Judgment normal.      LABS: Recent Results (from the past 2160 hour(s))  CBC w/Diff/Platelet     Status: Abnormal   Collection Time: 01/18/22  8:51 AM  Result Value Ref Range   WBC 7.4 3.4 - 10.8 x10E3/uL   RBC 5.65 (H) 3.77 - 5.28 x10E6/uL   Hemoglobin 10.8 (L) 11.1 - 15.9 g/dL   Hematocrit 35.3 34.0 - 46.6 %   MCV 63 (L) 79 - 97 fL   MCH 19.1 (L) 26.6 - 33.0 pg   MCHC 30.6 (L) 31.5 - 35.7 g/dL   RDW 17.5 (H) 11.7 - 15.4 %   Platelets 226 150 - 450 x10E3/uL   Neutrophils 57 Not Estab. %   Lymphs 33 Not Estab. %   Monocytes 7 Not Estab. %   Eos 3 Not Estab. %   Basos 0 Not Estab. %   Neutrophils Absolute 4.2 1.4 - 7.0 x10E3/uL   Lymphocytes Absolute 2.4 0.7 - 3.1 x10E3/uL   Monocytes Absolute 0.5 0.1 - 0.9 x10E3/uL   EOS (ABSOLUTE) 0.2 0.0 - 0.4 x10E3/uL   Basophils Absolute 0.0 0.0 - 0.2 x10E3/uL   Immature Granulocytes 0 Not Estab. %   Immature Grans (Abs) 0.0 0.0 - 0.1 x10E3/uL  Comprehensive metabolic panel     Status: Abnormal   Collection Time: 01/18/22  8:51 AM  Result Value  Ref Range   Glucose 102 (H) 70 - 99 mg/dL   BUN 14 8 - 27 mg/dL   Creatinine, Ser 0.70 0.57 - 1.00 mg/dL   eGFR 92 >59 mL/min/1.73   BUN/Creatinine Ratio 20 12 - 28   Sodium 135 134 - 144 mmol/L   Potassium 4.5 3.5 - 5.2 mmol/L   Chloride 98 96 - 106 mmol/L  CO2 21 20 - 29 mmol/L   Calcium 9.5 8.7 - 10.3 mg/dL   Total Protein 7.1 6.0 - 8.5 g/dL   Albumin 4.5 3.8 - 4.8 g/dL   Globulin, Total 2.6 1.5 - 4.5 g/dL   Albumin/Globulin Ratio 1.7 1.2 - 2.2   Bilirubin Total 1.7 (H) 0.0 - 1.2 mg/dL   Alkaline Phosphatase 77 44 - 121 IU/L   AST 25 0 - 40 IU/L   ALT 23 0 - 32 IU/L  Lipid Panel With LDL/HDL Ratio     Status: Abnormal   Collection Time: 01/18/22  8:51 AM  Result Value Ref Range   Cholesterol, Total 154 100 - 199 mg/dL   Triglycerides 156 (H) 0 - 149 mg/dL   HDL 42 >39 mg/dL   VLDL Cholesterol Cal 27 5 - 40 mg/dL   LDL Chol Calc (NIH) 85 0 - 99 mg/dL   LDL/HDL Ratio 2.0 0.0 - 3.2 ratio    Comment:                                     LDL/HDL Ratio                                             Men  Women                               1/2 Avg.Risk  1.0    1.5                                   Avg.Risk  3.6    3.2                                2X Avg.Risk  6.2    5.0                                3X Avg.Risk  8.0    6.1   TSH + free T4     Status: None   Collection Time: 01/18/22  8:51 AM  Result Value Ref Range   TSH 3.700 0.450 - 4.500 uIU/mL   Free T4 1.13 0.82 - 1.77 ng/dL  VITAMIN D 25 Hydroxy (Vit-D Deficiency, Fractures)     Status: None   Collection Time: 01/18/22  8:51 AM  Result Value Ref Range   Vit D, 25-Hydroxy 35.4 30.0 - 100.0 ng/mL    Comment: Vitamin D deficiency has been defined by the [place] practice guideline as a level of serum 25-OH vitamin D less than 20 ng/mL (1,2). The Endocrine Society went on to further define vitamin D insufficiency as a level between 21 and 29 ng/mL (2). 1. IOM (Institute of  Medicine). 2010. Dietary reference    intakes for calcium and D. [place]: The    [employer]. 2. Holick MF, Binkley [place], Bischoff-Ferrari HA, et al.    Evaluation, treatment, and prevention of vitamin D    deficiency: an Endocrine Society clinical practice    guideline. JCEM.  2011 Jul; 96(7):1911-30.   B12 and Folate Panel     Status: Abnormal   Collection Time: 01/18/22  8:51 AM  Result Value Ref Range   Vitamin B-12 196 (L) 232 - 1,245 pg/mL   Folate 7.4 >3.0 ng/mL    Comment: A serum folate concentration of less than 3.1 ng/mL is considered to represent clinical deficiency.   Fe+TIBC+Fer     Status: None   Collection Time: 01/18/22  8:51 AM  Result Value Ref Range   Total Iron Binding Capacity 364 250 - 450 ug/dL   UIBC 277 118 - 369 ug/dL   Iron 87 27 - 139 ug/dL   Iron Saturation 24 15 - 55 %   Ferritin 29 15 - 150 ng/mL  POCT glycosylated hemoglobin (Hb A1C)     Status: Abnormal   Collection Time: 04/04/22  3:49 PM  Result Value Ref Range   Hemoglobin A1C 6.1 (A) 4.0 - 5.6 %   HbA1c POC (<> result, manual entry)     HbA1c, POC (prediabetic range)     HbA1c, POC (controlled diabetic range)          Assessment/Plan: 1. Encounter for general adult medical examination with abnormal findings CPE performed, labs reviewed, due for colon screening  2. Essential hypertension Stable, continue coreg  3. Impaired fasting blood sugar A1c is 6.1 which is prediabetic and will work on diet and exercise and we will monitor  4. B12 deficiency Continue B12 supplement  5. Need for shingles vaccine - Zoster Vaccine Adjuvanted [facility]) injection; Inject 0.5 mLs into the muscle once for 1 dose.  Dispense: 0.5 mL; Refill: 0  6. Need for pneumococcal vaccination - pneumococcal 20-valent conjugate vaccine (PREVNAR 20) 0.5 ML injection; Inject 0.5 mLs into the muscle tomorrow at 10 am for 1 dose.  Dispense: 0.5 mL; Refill: 0  7. Screening for colorectal  cancer - Cologuard  8. Visit for gynecologic examination Breast exam performed, declines mammogram  9. Dysuria - UA/M w/rflx Culture, Routine   General Counseling: Aliz verbalizes understanding of the findings of todays visit and agrees with plan of treatment. I have discussed any further diagnostic evaluation that may be needed or ordered today. We also reviewed her medications today. she has been encouraged to call the office with any questions or concerns that should arise related to todays visit.    Counseling:    Orders Placed This Encounter  Procedures   UA/M w/rflx Culture, Routine   Cologuard   POCT glycosylated hemoglobin (Hb A1C)    Meds ordered this encounter  Medications   pneumococcal 20-valent conjugate vaccine (PREVNAR 20) 0.5 ML injection    Sig: Inject 0.5 mLs into the muscle tomorrow at 10 am for 1 dose.    Dispense:  0.5 mL    Refill:  0   Zoster Vaccine Adjuvanted [facility]) injection    Sig: Inject 0.5 mLs into the muscle once for 1 dose.    Dispense:  0.5 mL    Refill:  0    This patient was seen by Drema Dallas, PA-C in collaboration with Dr. Clayborn Bigness as a part of collaborative care agreement.  Total time spent:35 Minutes  Time spent includes review of chart, medications, test results, and follow up plan with the patient.     Lavera Guise, MD  Internal Medicine

## 2022-05-30 ENCOUNTER — Ambulatory Visit: Payer: Medicaid Other | Attending: Internal Medicine | Admitting: Internal Medicine

## 2022-05-30 ENCOUNTER — Encounter: Payer: Self-pay | Admitting: Internal Medicine

## 2022-05-30 VITALS — BP 140/80 | HR 86 | Ht 65.0 in | Wt 242.0 lb

## 2022-05-30 DIAGNOSIS — I10 Essential (primary) hypertension: Secondary | ICD-10-CM | POA: Diagnosis not present

## 2022-05-30 DIAGNOSIS — R002 Palpitations: Secondary | ICD-10-CM

## 2022-05-30 DIAGNOSIS — R0789 Other chest pain: Secondary | ICD-10-CM | POA: Diagnosis not present

## 2022-05-30 DIAGNOSIS — F418 Other specified anxiety disorders: Secondary | ICD-10-CM | POA: Diagnosis not present

## 2022-05-30 MED ORDER — ALPRAZOLAM 0.25 MG PO TABS: 0.2500 mg | ORAL_TABLET | Freq: Three times a day (TID) | ORAL | 0 refills | Status: DC | PRN

## 2022-05-30 NOTE — Progress Notes (Signed)
New Outpatient Visit Date: 05/30/2022  Primary Care provider: Carolynne Edouard [place],  [place] [address]  Chief Complaint: Palpitations  HPI:  Faith Hernandez is a 72 y.o. female who is being seen today as a self-referral for evaluation of palpitations and chest discomfort. She is accompanied by her son, Rogue Jury, who assists with interpretation and providing history, as [name] only speaks Arabic.  She has a history of hypertension and thalassemia.  Over the last month, she has noticed intermittent palpitations with associated vague lower chest and epigastric discomfort.  It typically occurs when she first gets up in the morning and resolves spontaneously over the course of about an hour.  Onset was around the time that her brother died somewhat unexpectedly.  She is also travelling to [place]; she has experienced similar symptoms in the past prior to a long trip.  She walks regularly without any chest pain.  She denies shortness of breath, lightheadedness, and edema.   --------------------------------------------------------------------------------------------------  Cardiovascular History & Procedures: Cardiovascular Problems: Palpitations  Risk Factors: Hypertension, obesity, and age > 42  Cath/PCI: None  CV Surgery: None  EP Procedures and Devices: None  Non-Invasive Evaluation(s): None  Recent CV Pertinent Labs: Lab Results  Component Value Date   CHOL 154 01/18/2022   HDL 42 01/18/2022   LDLCALC 85 01/18/2022   TRIG 156 (H) 01/18/2022   K 4.5 01/18/2022   BUN 14 01/18/2022   CREATININE 0.70 01/18/2022    --------------------------------------------------------------------------------------------------  Past Medical History:  Diagnosis Date   Anemia    Hypertension     History reviewed. No pertinent surgical history.  Current Meds  Medication Sig   carvedilol (COREG) 12.5 MG tablet Take 1 tablet (12.5 mg total) by mouth  2 (two) times daily.   cholecalciferol (VITAMIN D3) 25 MCG (1000 UNIT) tablet Take 1,000 Units by mouth daily.   cyanocobalamin (VITAMIN B12) 1000 MCG tablet Take 1,000 mcg by mouth daily.    Allergies: Patient has no known allergies.  Social History   Tobacco Use   Smoking status: Never   Smokeless tobacco: Never  Vaping Use   Vaping Use: Never used  Substance Use Topics   Alcohol use: Never   Drug use: Never    Family History  Problem Relation Age of Onset   Heart attack Brother    Hypertension Brother    Hyperlipidemia Brother     Review of Systems: A 12-system review of systems was performed and was negative except as noted in the HPI.  --------------------------------------------------------------------------------------------------  Physical Exam: BP (!) 140/80 (BP Location: Right Arm, Patient Position: Sitting, Cuff Size: Large)   Pulse 86   Ht '5\' 5"'$  (1.651 m)   Wt 242 lb (109.8 kg)   SpO2 98%   BMI 40.27 kg/m  Repeat BP:  -Left: 126/70 -Right: 128/74  General:  NAD. HEENT: No conjunctival pallor or scleral icterus. Neck: Supple without lymphadenopathy, thyromegaly, JVD, or HJR. No carotid bruit. Lungs: Normal work of breathing. Clear to auscultation bilaterally without wheezes or crackles. Heart: Regular rate and rhythm without murmurs, rubs, or gallops. Non-displaced PMI. Abd: Bowel sounds present. Soft, NT/ND without hepatosplenomegaly Ext: No lower extremity edema. Radial, PT, and DP pulses are 2+ bilaterally Skin: Warm and dry without rash.  EKG:  Normal sinus rhythm with borderline LVH.  No significant abnormality.  Lab Results  Component Value Date   WBC 7.4 01/18/2022   HGB 10.8 (L) 01/18/2022   HCT 35.3 01/18/2022  MCV 63 (L) 01/18/2022   PLT 226 01/18/2022    Lab Results  Component Value Date   NA 135 01/18/2022   K 4.5 01/18/2022   CL 98 01/18/2022   CO2 21 01/18/2022   BUN 14 01/18/2022   CREATININE 0.70 01/18/2022   GLUCOSE  102 (H) 01/18/2022   ALT 23 01/18/2022    Lab Results  Component Value Date   CHOL 154 01/18/2022   HDL 42 01/18/2022   LDLCALC 85 01/18/2022   TRIG 156 (H) 01/18/2022   --------------------------------------------------------------------------------------------------  ASSESSMENT AND PLAN: Palpitations, chest discomfort, and situational anxiety: Symptoms present for the last month, seeming precipitated by brother's death as well as stress surrounding upcoming trip to [place].  She does not have any exertional symptoms.  EKG today without ischemic changes.  Labs in 12/2021 were unremarkable other than chronic anemia and mild hyperbilirubinemia, likely related to her history of thalassemia minor.  We have agreed to defer additional testing at this time.  We will continue with carvedilol 12.5 mg twice daily.  After discussing the risks and benefits of benzodiazepine use for situational anxiety, we have agreed to a trial of alprazolam 0.25 mg every 8 hours as needed.  If she has persistent symptoms of anxiety, she will need to follow-up with her PCP for further evaluation and long-term management.  Hypertension: BP borderline elevated in right arm only on initial check, improved and symmetric on manual recheck.  Continue current dose of carvedilol.  Follow-up: Return to clinic as needed if symptoms persist or recur in the future.  Nelva Bush, MD 05/30/2022 9:31 AM

## 2022-05-30 NOTE — Patient Instructions (Signed)
Medication Instructions:  Your physician recommends the following medication changes.  START TAKING: Xanax 0.25 mg by mouth every 8 hours as needed   *If you need a refill on your cardiac medications before your next appointment, please call your pharmacy*   Lab Work: None ordered today   Testing/Procedures: None ordered today   Follow-Up: At [facility], you and your health needs are our priority.  As part of our continuing mission to provide you with exceptional heart care, we have created designated Provider Care Teams.  These Care Teams include your primary Cardiologist (physician) and Advanced Practice Providers (APPs -  Physician Assistants and Nurse Practitioners) who all work together to provide you with the care you need, when you need it.  We recommend signing up for the patient portal called "MyChart".  Sign up information is provided on this After Visit Summary.  MyChart is used to connect with patients for Virtual Visits (Telemedicine).  Patients are able to view lab/test results, encounter notes, upcoming appointments, etc.  Non-urgent messages can be sent to your provider as well.   To learn more about what you can do with MyChart, go to [email or web address].    Your next appointment:   As needed  The format for your next appointment:   In Person  Provider:   Nelva Bush, MD

## 2022-11-14 ENCOUNTER — Telehealth: Payer: Self-pay

## 2022-11-14 LAB — COLOGUARD: COLOGUARD: NEGATIVE

## 2022-11-14 NOTE — Telephone Encounter (Signed)
Left message for patient regarding negative cologuard.

## 2022-11-14 NOTE — Telephone Encounter (Signed)
-----   Message from Carlean Jews, PA-C sent at 11/14/2022 12:57 PM EDT ----- Please let pt know that her cologuard was negative

## 2022-12-12 ENCOUNTER — Other Ambulatory Visit: Payer: Self-pay | Admitting: Internal Medicine

## 2022-12-12 DIAGNOSIS — I10 Essential (primary) hypertension: Secondary | ICD-10-CM

## 2023-04-10 ENCOUNTER — Ambulatory Visit (INDEPENDENT_AMBULATORY_CARE_PROVIDER_SITE_OTHER): Payer: Medicaid Other | Admitting: Physician Assistant

## 2023-04-10 ENCOUNTER — Encounter: Payer: Self-pay | Admitting: Physician Assistant

## 2023-04-10 VITALS — BP 135/80 | HR 69 | Temp 98.5°F | Resp 16 | Ht 65.0 in | Wt 240.6 lb

## 2023-04-10 DIAGNOSIS — I10 Essential (primary) hypertension: Secondary | ICD-10-CM | POA: Diagnosis not present

## 2023-04-10 DIAGNOSIS — Z0001 Encounter for general adult medical examination with abnormal findings: Secondary | ICD-10-CM | POA: Diagnosis not present

## 2023-04-10 DIAGNOSIS — R7303 Prediabetes: Secondary | ICD-10-CM | POA: Diagnosis not present

## 2023-04-10 MED ORDER — CARVEDILOL 12.5 MG PO TABS: 12.5000 mg | ORAL_TABLET | Freq: Two times a day (BID) | ORAL | 3 refills | Status: DC

## 2023-04-10 NOTE — Progress Notes (Signed)
[facility] [address] [place], [place] [address]  Internal MEDICINE  Office Visit Note  Patient Name: Faith Hernandez  [id number]  [id number]  Date of Service: 04/16/2023  Chief Complaint  Patient presents with   Annual Exam   Hypertension   Quality Metric Gaps    TDAP, Shingles, Pneumonia and Flu Vaccine     HPI Pt is here for routine health maintenance examination, her son is with her and assists with translation -Pt did cologuard earlier this year, declines mammogram -A little underlying anxiety, but doing well -Bp has been stable -due for shingles, PNA, and tdap and will have these done -lab slip given  Current Medication: Outpatient Encounter Medications as of 04/10/2023  Medication Sig   cholecalciferol (VITAMIN D3) 25 MCG (1000 UNIT) tablet Take 1,000 Units by mouth daily.   cyanocobalamin (VITAMIN B12) 1000 MCG tablet Take 1,000 mcg by mouth daily.   [DISCONTINUED] ALPRAZolam (XANAX) 0.25 MG tablet Take 1 tablet (0.25 mg total) by mouth every 8 (eight) hours as needed for anxiety.   [DISCONTINUED] carvedilol (COREG) 12.5 MG tablet TAKE 1 TABLET BY MOUTH 2 TIMES DAILY.   carvedilol (COREG) 12.5 MG tablet Take 1 tablet (12.5 mg total) by mouth 2 (two) times daily.   No facility-administered encounter medications on file as of 04/10/2023.    Surgical History: History reviewed. No pertinent surgical history.  Medical History: Past Medical History:  Diagnosis Date   Anemia    Hypertension     Family History: Family History  Problem Relation Age of Onset   Heart attack Brother    Hypertension Brother    Hyperlipidemia Brother       Review of Systems  Constitutional:  Negative for chills, fatigue and unexpected weight change.  HENT:  Negative for congestion, rhinorrhea, sneezing and sore throat.   Eyes:  Negative for redness.  Respiratory:  Negative for cough, chest tightness and shortness of breath.   Cardiovascular:  Negative for  chest pain and palpitations.  Gastrointestinal:  Negative for abdominal pain, constipation, diarrhea, nausea and vomiting.  Genitourinary:  Negative for dysuria and frequency.  Musculoskeletal:  Negative for arthralgias, back pain, joint swelling and neck pain.  Skin:  Negative for rash.  Neurological: Negative.  Negative for tremors and numbness.  Hematological:  Negative for adenopathy. Does not bruise/bleed easily.  Psychiatric/Behavioral:  Negative for behavioral problems (Depression), sleep disturbance and suicidal ideas. The patient is not nervous/anxious.      Vital Signs: BP 135/80   Pulse 69   Temp 98.5 F (36.9 C)   Resp 16   Ht 5\' 5"  (1.651 m)   Wt 240 lb 9.6 oz (109.1 kg)   SpO2 99%   BMI 40.04 kg/m    Physical Exam Vitals and nursing note reviewed.  Constitutional:      General: She is not in acute distress.    Appearance: Normal appearance. She is well-developed. She is not diaphoretic.  HENT:     Head: Normocephalic and atraumatic.     Mouth/Throat:     Pharynx: No oropharyngeal exudate.  Eyes:     Pupils: Pupils are equal, round, and reactive to light.  Neck:     Thyroid: No thyromegaly.     Vascular: No JVD.     Trachea: No tracheal deviation.  Cardiovascular:     Rate and Rhythm: Normal rate and regular rhythm.     Heart sounds: Normal heart sounds. No murmur heard.    No friction rub. No  gallop.  Pulmonary:     Effort: Pulmonary effort is normal. No respiratory distress.     Breath sounds: No wheezing or rales.  Chest:     Chest wall: No tenderness.  Breasts:    Right: Normal. No mass.     Left: Normal. No mass.  Abdominal:     General: Bowel sounds are normal.     Palpations: Abdomen is soft.  Musculoskeletal:        General: Normal range of motion.     Cervical back: Normal range of motion and neck supple.     Right lower leg: No edema.     Left lower leg: No edema.  Lymphadenopathy:     Cervical: No cervical adenopathy.  Skin:     General: Skin is warm and dry.  Neurological:     Mental Status: She is alert and oriented to person, place, and time.     Cranial Nerves: No cranial nerve deficit.  Psychiatric:        Behavior: Behavior normal.        Thought Content: Thought content normal.        Judgment: Judgment normal.      LABS: No results found for this or any previous visit (from the past 2160 hour(s)).      Assessment/Plan: 1. Encounter for general adult medical examination with abnormal findings CPE performed, due for vaccines, UTD on colon screening. Lab slip given  2. Essential hypertension Stable, continue coreg as before - carvedilol (COREG) 12.5 MG tablet; Take 1 tablet (12.5 mg total) by mouth 2 (two) times daily.  Dispense: 180 tablet; Refill: 3  3. Prediabetes Will check A1c with labs   General Counseling: Lyly verbalizes understanding of the findings of todays visit and agrees with plan of treatment. I have discussed any further diagnostic evaluation that may be needed or ordered today. We also reviewed her medications today. she has been encouraged to call the office with any questions or concerns that should arise related to todays visit.    Counseling:    No orders of the defined types were placed in this encounter.   Meds ordered this encounter  Medications   carvedilol (COREG) 12.5 MG tablet    Sig: Take 1 tablet (12.5 mg total) by mouth 2 (two) times daily.    Dispense:  180 tablet    Refill:  3    This patient was seen by Lynn Ito, PA-C in collaboration with Dr. Beverely Risen as a part of collaborative care agreement.  Total time spent:35 Minutes  Time spent includes review of chart, medications, test results, and follow up plan with the patient.     Lyndon Code, MD  Internal Medicine

## 2023-04-15 ENCOUNTER — Other Ambulatory Visit: Payer: Self-pay | Admitting: Physician Assistant

## 2023-04-17 LAB — URINALYSIS, COMPLETE
Bilirubin, UA: NEGATIVE
Glucose, UA: NEGATIVE
Ketones, UA: NEGATIVE
Nitrite, UA: NEGATIVE
Protein,UA: NEGATIVE
RBC, UA: NEGATIVE
Specific Gravity, UA: 1.011 (ref 1.005–1.030)
Urobilinogen, Ur: 0.2 mg/dL (ref 0.2–1.0)
pH, UA: 6 (ref 5.0–7.5)

## 2023-04-17 LAB — COMPREHENSIVE METABOLIC PANEL
ALT: 17 [IU]/L (ref 0–32)
AST: 14 [IU]/L (ref 0–40)
Albumin: 4.1 g/dL (ref 3.8–4.8)
Alkaline Phosphatase: 83 [IU]/L (ref 44–121)
BUN/Creatinine Ratio: 16 (ref 12–28)
BUN: 11 mg/dL (ref 8–27)
Bilirubin Total: 1.3 mg/dL — ABNORMAL HIGH (ref 0.0–1.2)
CO2: 23 mmol/L (ref 20–29)
Calcium: 9.4 mg/dL (ref 8.7–10.3)
Chloride: 103 mmol/L (ref 96–106)
Creatinine, Ser: 0.67 mg/dL (ref 0.57–1.00)
Globulin, Total: 2.9 g/dL (ref 1.5–4.5)
Glucose: 110 mg/dL — ABNORMAL HIGH (ref 70–99)
Potassium: 4.7 mmol/L (ref 3.5–5.2)
Sodium: 138 mmol/L (ref 134–144)
Total Protein: 7 g/dL (ref 6.0–8.5)
eGFR: 92 mL/min/{1.73_m2} (ref >59)

## 2023-04-17 LAB — CBC WITH DIFFERENTIAL/PLATELET
Basophils Absolute: 0 10*3/uL (ref 0.0–0.2)
Basos: 1 %
EOS (ABSOLUTE): 0.2 10*3/uL (ref 0.0–0.4)
Eos: 2 %
Hematocrit: 37.7 % (ref 34.0–46.6)
Hemoglobin: 10.9 g/dL — ABNORMAL LOW (ref 11.1–15.9)
Immature Grans (Abs): 0 10*3/uL (ref 0.0–0.1)
Immature Granulocytes: 1 %
Lymphocytes Absolute: 3.1 10*3/uL (ref 0.7–3.1)
Lymphs: 41 %
MCH: 19.1 pg — ABNORMAL LOW (ref 26.6–33.0)
MCHC: 28.9 g/dL — ABNORMAL LOW (ref 31.5–35.7)
MCV: 66 fL — ABNORMAL LOW (ref 79–97)
Monocytes Absolute: 0.6 10*3/uL (ref 0.1–0.9)
Monocytes: 7 %
Neutrophils Absolute: 3.8 10*3/uL (ref 1.4–7.0)
Neutrophils: 48 %
Platelets: 242 10*3/uL (ref 150–450)
RBC: 5.7 x10E6/uL — ABNORMAL HIGH (ref 3.77–5.28)
RDW: 18.8 % — ABNORMAL HIGH (ref 11.7–15.4)
WBC: 7.7 10*3/uL (ref 3.4–10.8)

## 2023-04-17 LAB — MICROALBUMIN / CREATININE URINE RATIO
Creatinine, Urine: 30.5 mg/dL
Microalb/Creat Ratio: 58 mg/g{creat} — ABNORMAL HIGH (ref 0–29)
Microalbumin, Urine: 17.8 ug/mL

## 2023-04-17 LAB — B12 AND FOLATE PANEL
Folate: 5.6 ng/mL (ref >3.0)
Vitamin B-12: 908 pg/mL (ref 232–1245)

## 2023-04-17 LAB — LIPID PANEL WITH LDL/HDL RATIO
Cholesterol, Total: 151 mg/dL (ref 100–199)
HDL: 40 mg/dL (ref >39)
LDL Chol Calc (NIH): 83 mg/dL (ref 0–99)
LDL/HDL Ratio: 2.1 ratio (ref 0.0–3.2)
Triglycerides: 163 mg/dL — ABNORMAL HIGH (ref 0–149)
VLDL Cholesterol Cal: 28 mg/dL (ref 5–40)

## 2023-04-17 LAB — VITAMIN D 25 HYDROXY (VIT D DEFICIENCY, FRACTURES): Vit D, 25-Hydroxy: 54.4 ng/mL (ref 30.0–100.0)

## 2023-04-17 LAB — MICROSCOPIC EXAMINATION
Bacteria, UA: NONE SEEN
Casts: NONE SEEN /[LPF]
Epithelial Cells (non renal): NONE SEEN /[HPF] (ref 0–10)
RBC, Urine: NONE SEEN /[HPF] (ref 0–2)
WBC, UA: NONE SEEN /[HPF] (ref 0–5)

## 2023-04-17 LAB — TSH: TSH: 2.25 u[IU]/mL (ref 0.450–4.500)

## 2023-04-17 LAB — IRON AND TIBC
Iron Saturation: 24 % (ref 15–55)
Iron: 83 ug/dL (ref 27–139)
Total Iron Binding Capacity: 346 ug/dL (ref 250–450)
UIBC: 263 ug/dL (ref 118–369)

## 2023-04-17 LAB — URINE CULTURE: Organism ID, Bacteria: NO GROWTH

## 2023-04-17 LAB — FERRITIN: Ferritin: 38 ng/mL (ref 15–150)

## 2023-04-17 LAB — HGB A1C W/O EAG: Hgb A1c MFr Bld: 6.3 % — ABNORMAL HIGH (ref 4.8–5.6)

## 2023-04-17 LAB — T4, FREE: Free T4: 1.06 ng/dL (ref 0.82–1.77)

## 2023-04-18 ENCOUNTER — Telehealth: Payer: Self-pay

## 2023-04-18 NOTE — Telephone Encounter (Signed)
-----   Message from Carlean Jews sent at 04/18/2023 12:31 PM EST ----- Please let her know that her hemoglobin is still a little low but still stable from last year, TG a little elevated, A1c was 6.3 so still in prediabetic range but increased from last year and to work on diet to limit sugar/carbs. We could consider trying a diabetic medication such as Farxiga/jardiance if she is open to this to help keep sugars down. She also had increased protein in urine likely due to sugar rising and would want to consider adding low dose ACEI such as 2.5mg  Lisinopril to help with this.

## 2023-04-18 NOTE — Telephone Encounter (Signed)
Left message for patient's son regarding lab results.

## 2023-04-28 ENCOUNTER — Telehealth: Payer: Self-pay

## 2023-04-28 ENCOUNTER — Other Ambulatory Visit: Payer: Self-pay | Admitting: Physician Assistant

## 2023-04-28 DIAGNOSIS — R7303 Prediabetes: Secondary | ICD-10-CM

## 2023-04-28 DIAGNOSIS — R808 Other proteinuria: Secondary | ICD-10-CM

## 2023-04-28 MED ORDER — LISINOPRIL 2.5 MG PO TABS: 2.5000 mg | ORAL_TABLET | Freq: Every day | ORAL | 3 refills | Status: DC

## 2023-04-28 MED ORDER — SEMAGLUTIDE(0.25 OR 0.5MG/DOS) 2 MG/3ML ~~LOC~~ SOPN: 0.2500 mg | PEN_INJECTOR | SUBCUTANEOUS | 2 refills | Status: DC

## 2023-04-29 ENCOUNTER — Telehealth: Payer: Self-pay

## 2023-04-29 NOTE — Telephone Encounter (Signed)
Done

## 2023-04-29 NOTE — Telephone Encounter (Signed)
Completed P.A. for patient's Ozempic. 

## 2023-04-30 ENCOUNTER — Ambulatory Visit: Payer: Medicaid Other

## 2023-04-30 ENCOUNTER — Encounter: Payer: Self-pay | Admitting: Internal Medicine

## 2023-04-30 ENCOUNTER — Ambulatory Visit: Payer: Medicaid Other | Attending: Internal Medicine | Admitting: Internal Medicine

## 2023-04-30 VITALS — BP 124/74 | HR 77 | Ht 63.0 in | Wt 243.2 lb

## 2023-04-30 DIAGNOSIS — R002 Palpitations: Secondary | ICD-10-CM | POA: Diagnosis not present

## 2023-04-30 DIAGNOSIS — I10 Essential (primary) hypertension: Secondary | ICD-10-CM

## 2023-04-30 DIAGNOSIS — R072 Precordial pain: Secondary | ICD-10-CM | POA: Insufficient documentation

## 2023-04-30 MED ORDER — NITROGLYCERIN 0.4 MG SL SUBL: 0.4000 mg | SUBLINGUAL_TABLET | SUBLINGUAL | 3 refills | Status: AC | PRN

## 2023-04-30 MED ORDER — ASPIRIN 81 MG PO TBEC: 81.0000 mg | DELAYED_RELEASE_TABLET | Freq: Every day | ORAL | Status: DC

## 2023-04-30 MED ORDER — METOPROLOL TARTRATE 100 MG PO TABS: ORAL_TABLET | ORAL | 0 refills | Status: DC

## 2023-04-30 NOTE — Progress Notes (Signed)
Cardiology Office Note:  .   Date:  04/30/2023  ID:  Faith Hernandez, DOB 24-Mar-1951, MRN [id number] PCP: Alan Ripper   HeartCare Providers Cardiologist:  None     History of Present Illness: .   Faith Hernandez is a 72 y.o. female with history of hypertension and thalassemia, who presents for reevaluation of palpitations and chest pain.  I met her in January, at which time she reported intermittent palpitations and vague lower chest/epigastric discomfort.  Symptoms began around the time of her brother's death and it was thought that anxiety was playing a role.  We agreed to continue carvedilol and defer additional testing.  Today, [name] reports that she has been having more frequent discomfort in her chest over the last month.  It began around the time that she travel to [place], which was an anxiety provoking situation for her.  However, since returning home the symptoms have not abated.  She often wakes up around 3 AM with a feeling of something pressing on her chest.  She also describes an uneasy feeling but does not endorse frank palpitations.  The episode usually lasts until around 7 AM when it resolves on its own.  Getting up and moving around seems to help.  She does not have any exertional symptoms or associated symptoms like shortness of breath, lightheadedness, diaphoresis, and nausea.  She has been experiencing these episodes for at least 2 years, though they have been occurring almost daily over the last month.  ROS: See HPI  Studies Reviewed: Marland Kitchen   EKG Interpretation Date/Time:  Wednesday April 30 2023 11:46:13 EST Ventricular Rate:  77 PR Interval:  166 QRS Duration:  80 QT Interval:  380 QTC Calculation: 430 R Axis:   25  Text Interpretation: Normal sinus rhythm Nonspecific ST abnormality Abnormal ECG When compared with ECG of 30-May-2022 No significant change was found Confirmed by Treacy Holcomb, Cristal Deer [phone]) on 04/30/2023 3:16:40 PM    Risk  Assessment/Calculations:             Physical Exam:   VS:  BP 124/74 (BP Location: Left Arm, Patient Position: Sitting, Cuff Size: Normal)   Pulse 77   Ht 5\' 3"  (1.6 m)   Wt 243 lb 3.2 oz (110.3 kg)   SpO2 98%   BMI 43.08 kg/m    Wt Readings from Last 3 Encounters:  04/30/23 243 lb 3.2 oz (110.3 kg)  04/10/23 240 lb 9.6 oz (109.1 kg)  05/30/22 242 lb (109.8 kg)    General:  NAD. Neck: No JVD or HJR. Lungs: Clear to auscultation bilaterally without wheezes or crackles. Heart: Regular rate and rhythm without murmurs, rubs, or gallops. Abdomen: Soft, nontender, nondistended. Extremities: No lower extremity edema.  ASSESSMENT AND PLAN: .    Precordial pain and palpitations: Ms. Sedita has a long history of atypical chest pain and palpitations that seem to occur when she is in stressful situations.  However, symptoms have been occurring in the early morning hours almost every day over the last month.  Her exam today is unrevealing.  EKG shows normal sinus rhythm with subtle nonspecific ST segment changes that are similar to her last tracing from January.  We have discussed further workup options including referral to the emergency department, noninvasive ischemia testing, and cardiac catheterization.  We have agreed to obtain a coronary CTA at her earliest convenience to exclude significant CAD.  We will also arrange for a 14-day event monitor (ZIO XT) to assess for arrhythmias contributing  to her symptoms.  In the meantime, I have asked her to continue her current regimen of carvedilol and to begin taking aspirin 81 mg daily.  I have also provided her with a prescription for sublingual nitroglycerin to be taken as needed for chest pain.  I advised her to seek immediate medical attention for recurrent chest pain, particularly if it persists or does not improve with sublingual nitroglycerin.  Hypertension: Blood pressure well-controlled today.  Continue current dose of carvedilol.     Dispo: Return to clinic in 6 weeks.  Signed, Yvonne Kendall, MD

## 2023-04-30 NOTE — Patient Instructions (Addendum)
Medication Instructions:  Your physician recommends the following medication changes.   START TAKING: Aspirin 81 mg by mouth daily (May purchase over the counter)  A prescription has been sent in for Nitroglycerin.  If you have chest pain that doesn't relieve quickly, place one tablet under your tongue and allow it to dissolve.  If no relief after 5 minutes, you may take another pill.  If no relief after 5 minutes, you may take a 3rd dose but you need to call 911 and report to ER immediately.   *If you need a refill on your cardiac medications before your next appointment, please call your pharmacy*   Lab Work: No labs ordered today    Testing/Procedures: Cardiac CT Angiography (CTA), is a special type of CT scan that uses a computer to produce multi-dimensional views of major blood vessels throughout the body. In CT angiography, a contrast material is injected through an IV to help visualize the blood vessels Please see instructions below   Your physician has recommended that you wear a 14 day Zio monitor.   This monitor is a medical device that records the heart's electrical activity. Doctors most often use these monitors to diagnose arrhythmias. Arrhythmias are problems with the speed or rhythm of the heartbeat. The monitor is a small device applied to your chest. You can wear one while you do your normal daily activities. While wearing this monitor if you have any symptoms to push the button and record what you felt. Once you have worn this monitor for the period of time provider prescribed (Usually 14 days), you will return the monitor device in the postage paid box. Once it is returned they will download the data collected and provide [place] with a report which the provider will then review and we will call you with those results. Important tips:  Avoid showering during the first 24 hours of wearing the monitor. Avoid excessive sweating to help maximize wear time. Do not submerge the  device, no hot tubs, and no swimming pools. Keep any lotions or oils away from the patch. After 24 hours you may shower with the patch on. Take brief showers with your back facing the shower head.  Do not remove patch once it has been placed because that will interrupt data and decrease adhesive wear time. Push the button when you have any symptoms and write down what you were feeling. Once you have completed wearing your monitor, remove and place into box which has postage paid and place in your outgoing mailbox.  If for some reason you have misplaced your box then call our office and we can provide another box and/or mail it off for you.      Follow-Up: At [facility], you and your health needs are our priority.  As part of our continuing mission to provide you with exceptional heart care, we have created designated Provider Care Teams.  These Care Teams include your primary Cardiologist (physician) and Advanced Practice Providers (APPs -  Physician Assistants and Nurse Practitioners) who all work together to provide you with the care you need, when you need it.  We recommend signing up for the patient portal called "MyChart".  Sign up information is provided on this After Visit Summary.  MyChart is used to connect with patients for Virtual Visits (Telemedicine).  Patients are able to view lab/test results, encounter notes, upcoming appointments, etc.  Non-urgent messages can be sent to your provider as well.   To learn more about  what you can do with MyChart, go to [email or web address].    Your next appointment:   6 week(s)  Provider:   Yvonne Kendall, MD      Your cardiac CT will be scheduled at one of the below locations:    [facility] [address] [place], [place] [address] [phone]  OR   [facility] [address] [place], [place] [address] [phone]  If scheduled at [facility], please arrive at the [facility] and [facility] Entrance (Entrance C2) of [facility] 30 minutes prior to test start time. You can use the FREE valet parking offered at entrance C (encouraged to control the heart rate for the test)  Proceed to the [facility] Radiology Department (first floor) to check-in and test prep.  All radiology patients and guests should use entrance C2 at [facility], accessed from [facility], even though the hospital's physical address listed is [address].    If scheduled at [facility] or [facility], please arrive 15 mins early for check-in and test prep.  There is spacious parking and easy access to the radiology department from the [facility] Heart and Vascular entrance. Please enter here and check-in with the desk attendant.   Please follow these instructions carefully (unless otherwise directed):  An IV will be required for this test and Nitroglycerin will be given.   On the Night Before the Test: Be sure to Drink plenty of water. Do not consume any caffeinated/decaffeinated beverages or chocolate 12 hours prior to your test. Do not take any antihistamines 12 hours prior to your test.  On the Day of the Test: Drink plenty of water until 1 hour prior to the test. Do not eat any food 1 hour prior to test. You may take your regular medications prior to the test.  Take metoprolol (Lopressor) 100 mg two hours prior to test. Hold Carvedilol the morning of procedure  FEMALES- please wear underwire-free bra if available, avoid dresses & tight clothing      After the Test: Drink plenty of water. After receiving IV contrast, you may experience a mild flushed feeling. This is normal. On occasion, you may experience a mild rash up to 24 hours after the test. This is not dangerous. If this occurs, you can take Benadryl 25 mg and increase your fluid intake. If you experience  trouble breathing, this can be serious. If it is severe call 911 IMMEDIATELY. If it is mild, please call our office.  We will call to schedule your test 2-4 weeks out understanding that some insurance companies will need an authorization prior to the service being performed.   For more information and frequently asked questions, please visit our website : [email or web address]  For non-scheduling related questions, please contact the cardiac imaging nurse navigator should you have any questions/concerns: Cardiac Imaging Nurse Navigators Direct Office Dial: [phone]   For scheduling needs, including cancellations and rescheduling, please call [place], [phone].

## 2023-05-03 DIAGNOSIS — R002 Palpitations: Secondary | ICD-10-CM | POA: Diagnosis not present

## 2023-05-09 ENCOUNTER — Telehealth (HOSPITAL_COMMUNITY): Payer: Self-pay | Admitting: Emergency Medicine

## 2023-05-09 NOTE — Telephone Encounter (Signed)
Reaching out to patient to offer assistance regarding upcoming cardiac imaging study; pt verbalizes understanding of appt date/time, parking situation and where to check in, pre-test NPO status and medications ordered, and verified current allergies; name and call back number provided for further questions should they arise Rockwell Alexandria RN Navigator Cardiac Imaging [facility] [phone] office [phone] cell  Zio patch  Spoke to son who will relay instructions

## 2023-05-12 ENCOUNTER — Ambulatory Visit
Admission: RE | Admit: 2023-05-12 | Discharge: 2023-05-12 | Disposition: A | Discharge status: Home / Self Care | Payer: Medicaid Other | Source: Ambulatory Visit | Attending: Internal Medicine | Admitting: Internal Medicine

## 2023-05-12 DIAGNOSIS — R072 Precordial pain: Secondary | ICD-10-CM | POA: Diagnosis present

## 2023-05-12 MED ORDER — METOPROLOL TARTRATE 5 MG/5ML IV SOLN: 10.0000 mg | Freq: Once | INTRAVENOUS | Status: DC | PRN

## 2023-05-12 MED ORDER — METOPROLOL TARTRATE 5 MG/5ML IV SOLN
INTRAVENOUS | Status: AC
  Filled 2023-05-12: qty 5

## 2023-05-12 MED ORDER — DILTIAZEM HCL 25 MG/5ML IV SOLN: 10.0000 mg | INTRAVENOUS | Status: DC | PRN

## 2023-05-12 MED ORDER — IOHEXOL 350 MG/ML SOLN
80.0000 mL | Freq: Once | INTRAVENOUS | Status: AC | PRN
  Administered 2023-05-12: 80 mL via INTRAVENOUS

## 2023-05-12 MED ORDER — NITROGLYCERIN 0.4 MG SL SUBL
0.8000 mg | SUBLINGUAL_TABLET | Freq: Once | SUBLINGUAL | Status: AC
Start: 2023-05-12 — End: 2023-05-12
  Administered 2023-05-12: 0.8 mg via SUBLINGUAL
  Filled 2023-05-12: qty 25

## 2023-05-12 NOTE — Progress Notes (Signed)
Patient tolerated procedure well. Ambulate w/o difficulty. Denies any lightheadedness or being dizzy. Pt denies any pain at this time. Sitting in chair. Pt is encouraged to drink additional water throughout the day and reason explained to patient. Patient verbalized understanding and all questions answered. ABC intact. No further needs at this time. Discharge from procedure area w/o issues. 

## 2023-06-19 ENCOUNTER — Ambulatory Visit: Payer: Medicaid Other | Admitting: Internal Medicine

## 2023-07-08 ENCOUNTER — Telehealth: Payer: Self-pay

## 2023-07-08 NOTE — Telephone Encounter (Signed)
Lvm for patient's son, I need to speak with him regarding GI referral and med changes.

## 2024-04-15 ENCOUNTER — Encounter: Payer: Self-pay | Admitting: Physician Assistant

## 2024-04-15 ENCOUNTER — Ambulatory Visit (INDEPENDENT_AMBULATORY_CARE_PROVIDER_SITE_OTHER): Payer: Medicaid Other | Admitting: Physician Assistant

## 2024-04-15 VITALS — BP 105/70 | HR 84 | Temp 98.0°F | Resp 16 | Ht 63.0 in | Wt 242.0 lb

## 2024-04-15 DIAGNOSIS — Z23 Encounter for immunization: Secondary | ICD-10-CM

## 2024-04-15 DIAGNOSIS — R300 Dysuria: Secondary | ICD-10-CM

## 2024-04-15 DIAGNOSIS — K760 Fatty (change of) liver, not elsewhere classified: Secondary | ICD-10-CM

## 2024-04-15 DIAGNOSIS — I10 Essential (primary) hypertension: Secondary | ICD-10-CM | POA: Diagnosis not present

## 2024-04-15 DIAGNOSIS — R7303 Prediabetes: Secondary | ICD-10-CM

## 2024-04-15 DIAGNOSIS — Z0001 Encounter for general adult medical examination with abnormal findings: Secondary | ICD-10-CM

## 2024-04-15 DIAGNOSIS — R161 Splenomegaly, not elsewhere classified: Secondary | ICD-10-CM | POA: Diagnosis not present

## 2024-04-15 DIAGNOSIS — Z6841 Body Mass Index (BMI) 40.0 and over, adult: Secondary | ICD-10-CM

## 2024-04-15 DIAGNOSIS — R5383 Other fatigue: Secondary | ICD-10-CM

## 2024-04-15 DIAGNOSIS — Z1329 Encounter for screening for other suspected endocrine disorder: Secondary | ICD-10-CM

## 2024-04-15 DIAGNOSIS — E782 Mixed hyperlipidemia: Secondary | ICD-10-CM

## 2024-04-15 MED ORDER — TETANUS-DIPHTH-ACELL PERTUSSIS 5-2.5-18.5 LF-MCG/0.5 IM SUSP
0.5000 mL | Freq: Once | INTRAMUSCULAR | 0 refills | Status: AC
Start: 2024-04-15 — End: 2024-04-15

## 2024-04-15 MED ORDER — CARVEDILOL 12.5 MG PO TABS: 12.5000 mg | ORAL_TABLET | Freq: Two times a day (BID) | ORAL | 3 refills | Status: AC

## 2024-04-15 MED ORDER — PNEUMOCOCCAL 20-VAL CONJ VACC 0.5 ML IM SUSY: 0.5000 mL | PREFILLED_SYRINGE | INTRAMUSCULAR | 0 refills | Status: AC

## 2024-04-15 MED ORDER — ZOSTER VAC RECOMB ADJUVANTED 50 MCG/0.5ML IM SUSR: 0.5000 mL | Freq: Once | INTRAMUSCULAR | 0 refills | Status: AC

## 2024-04-15 NOTE — Progress Notes (Signed)
 [facility] [address] [place], [place] [address]  Internal MEDICINE  Office Visit Note  Patient Name: Faith Hernandez  [id number]  [id number]  Date of Service: 04/15/2024  Chief Complaint  Patient presents with   Annual Exam    Needs labs done to + wants to check if still pre-diabetic   Hypertension   Medication Refill    Coreg      HPI Pt is here for routine health maintenance examination, her son helps with translation -due for labs including A1c -BP stable, needs coreg  refills -again discussed non cardiac findings from coronary CT done by cardiology which mentioned fatty liver infiltration and spleen enlargement -She is not having any abdominal pain or any concerns. Prefers to wait on any GI referral regarding these findings and will check  labs -son does report a hx of thalassemia that contributes to anemia, but she has not had any problems with this -left knee bothersome, chronic. Not too painful. Just stiff in the morning. Not giving out  Current Medication: Outpatient Encounter Medications as of 04/15/2024  Medication Sig   cholecalciferol (VITAMIN D3) 25 MCG (1000 UNIT) tablet Take 1,000 Units by mouth daily.   cyanocobalamin (VITAMIN B12) 1000 MCG tablet Take 1,000 mcg by mouth daily.   nitroGLYCERIN  (NITROSTAT ) 0.4 MG SL tablet Place 1 tablet (0.4 mg total) under the tongue every 5 (five) minutes as needed.   [DISCONTINUED] aspirin  EC 81 MG tablet Take 1 tablet (81 mg total) by mouth daily. Swallow whole.   [DISCONTINUED] carvedilol  (COREG ) 12.5 MG tablet Take 1 tablet (12.5 mg total) by mouth 2 (two) times daily.   [DISCONTINUED] metoprolol  tartrate (LOPRESSOR ) 100 MG tablet TAKE 1 TABLET 2 HR PRIOR TO CARDIAC PROCEDURE   [DISCONTINUED] pneumococcal 20-valent conjugate vaccine (PREVNAR 20 ) 0.5 ML injection Inject 0.5 mLs into the muscle tomorrow at 10 am.   [DISCONTINUED] Tdap (BOOSTRIX) 5-2.5-18.5 LF-MCG/0.5 injection Inject 0.5 mLs into the muscle  once.   [DISCONTINUED] Zoster Vaccine Adjuvanted (SHINGRIX ) injection Inject 0.5 mLs into the muscle once.   carvedilol  (COREG ) 12.5 MG tablet Take 1 tablet (12.5 mg total) by mouth 2 (two) times daily.   [EXPIRED] pneumococcal 20-valent conjugate vaccine (PREVNAR 20 ) 0.5 ML injection Inject 0.5 mLs into the muscle tomorrow at 10 am for 1 dose.   [EXPIRED] Tdap (BOOSTRIX) 5-2.5-18.5 LF-MCG/0.5 injection Inject 0.5 mLs into the muscle once for 1 dose.   [EXPIRED] Zoster Vaccine Adjuvanted (SHINGRIX ) injection Inject 0.5 mLs into the muscle once for 1 dose.   No facility-administered encounter medications on file as of 04/15/2024.    Surgical History: History reviewed. No pertinent surgical history.  Medical History: Past Medical History:  Diagnosis Date   Anemia    Hypertension     Family History: Family History  Problem Relation Age of Onset   Heart attack Brother    Hypertension Brother    Hyperlipidemia Brother       Review of Systems  Constitutional:  Negative for chills, fatigue and unexpected weight change.  HENT:  Negative for congestion, rhinorrhea, sneezing and sore throat.   Eyes:  Negative for redness.  Respiratory:  Negative for cough, chest tightness and shortness of breath.   Cardiovascular:  Negative for chest pain and palpitations.  Gastrointestinal:  Negative for abdominal pain, blood in stool, constipation, diarrhea, nausea and vomiting.  Genitourinary:  Negative for dysuria and frequency.  Musculoskeletal:  Positive for arthralgias. Negative for back pain, joint swelling and neck pain.  Skin:  Negative for rash.  Neurological: Negative.  Negative for tremors and numbness.  Hematological:  Negative for adenopathy. Does not bruise/bleed easily.  Psychiatric/Behavioral:  Negative for behavioral problems (Depression), sleep disturbance and suicidal ideas. The patient is not nervous/anxious.      Vital Signs: BP 105/70   Pulse 84   Temp 98 F (36.7 C)    Resp 16   Ht 5' 3 (1.6 m)   Wt 242 lb (109.8 kg)   SpO2 97%   BMI 42.87 kg/m    Physical Exam Vitals and nursing note reviewed.  Constitutional:      General: She is not in acute distress.    Appearance: Normal appearance. She is well-developed. She is not diaphoretic.  HENT:     Head: Normocephalic and atraumatic.     Mouth/Throat:     Pharynx: No posterior oropharyngeal erythema.  Eyes:     Pupils: Pupils are equal, round, and reactive to light.  Neck:     Thyroid: No thyromegaly.     Vascular: No JVD.     Trachea: No tracheal deviation.  Cardiovascular:     Rate and Rhythm: Normal rate and regular rhythm.     Heart sounds: Normal heart sounds. No murmur heard.    No friction rub. No gallop.  Pulmonary:     Effort: Pulmonary effort is normal. No respiratory distress.     Breath sounds: No wheezing or rales.  Chest:     Chest wall: No tenderness.  Breasts:    Right: Normal. No mass.     Left: Normal. No mass.  Abdominal:     General: Bowel sounds are normal.     Palpations: Abdomen is soft.     Tenderness: There is no abdominal tenderness.  Musculoskeletal:        General: No tenderness. Normal range of motion.  Skin:    General: Skin is warm and dry.  Neurological:     Mental Status: She is alert and oriented to person, place, and time.  Psychiatric:        Behavior: Behavior normal.        Thought Content: Thought content normal.        Judgment: Judgment normal.      LABS: Recent Results (from the past 2160 hours)  UA/M w/rflx Culture, Routine     Status: Abnormal   Collection Time: 04/15/24  3:10 PM   Specimen: Urine   Urine  Result Value Ref Range   Specific Gravity, UA 1.018 1.005 - 1.030   pH, UA 5.5 5.0 - 7.5   Color, UA Yellow Yellow   Appearance Ur Clear Clear   Leukocytes,UA 2+ (A) Negative   Protein,UA Negative Negative/Trace   Glucose, UA Trace (A) Negative   Ketones, UA Negative Negative   RBC, UA Negative Negative   Bilirubin, UA  Negative Negative   Urobilinogen, Ur 0.2 0.2 - 1.0 mg/dL   Nitrite, UA Negative Negative   Microscopic Examination See below:     Comment: Microscopic was indicated and was performed.   Urinalysis Reflex Comment     Comment: This specimen has reflexed to a Urine Culture.  Microscopic Examination     Status: Abnormal   Collection Time: 04/15/24  3:10 PM   Urine  Result Value Ref Range   WBC, UA >30 (A) 0 - 5 /hpf   RBC, Urine 0-2 0 - 2 /hpf   Epithelial Cells (non renal) 0-10 0 - 10 /hpf   Casts None seen None seen /lpf  Bacteria, UA None seen None seen/Few  Urine Culture, Reflex     Status: None   Collection Time: 04/15/24  3:10 PM   Urine  Result Value Ref Range   Urine Culture, Routine Final report    Organism ID, Bacteria Comment     Comment: Mixed urogenital flora Less than 10,000 colonies/mL   CBC w/Diff/Platelet     Status: Abnormal   Collection Time: 04/19/24  7:08 AM  Result Value Ref Range   WBC 8.4 3.4 - 10.8 x10E3/uL   RBC 5.71 (H) 3.77 - 5.28 x10E6/uL   Hemoglobin 10.9 (L) 11.1 - 15.9 g/dL   Hematocrit 63.4 65.9 - 46.6 %   MCV 64 (L) 79 - 97 fL   MCH 19.1 (L) 26.6 - 33.0 pg   MCHC 29.9 (L) 31.5 - 35.7 g/dL   RDW 81.5 (H) 88.2 - 84.5 %   Platelets 239 150 - 450 x10E3/uL   Neutrophils 54 Not Estab. %   Lymphs 36 Not Estab. %   Monocytes 8 Not Estab. %   Eos 2 Not Estab. %   Basos 0 Not Estab. %   Neutrophils Absolute 4.5 1.4 - 7.0 x10E3/uL   Lymphocytes Absolute 3.0 0.7 - 3.1 x10E3/uL   Monocytes Absolute 0.7 0.1 - 0.9 x10E3/uL   EOS (ABSOLUTE) 0.1 0.0 - 0.4 x10E3/uL   Basophils Absolute 0.0 0.0 - 0.2 x10E3/uL   Immature Granulocytes 0 Not Estab. %   Immature Grans (Abs) 0.0 0.0 - 0.1 x10E3/uL  Comprehensive metabolic panel with GFR     Status: Abnormal   Collection Time: 04/19/24  7:08 AM  Result Value Ref Range   Glucose 117 (H) 70 - 99 mg/dL   BUN 14 8 - 27 mg/dL   Creatinine, Ser 9.26 0.57 - 1.00 mg/dL   eGFR 86 >40 fO/fpw/8.26   BUN/Creatinine  Ratio 19 12 - 28   Sodium 136 134 - 144 mmol/L   Potassium 4.8 3.5 - 5.2 mmol/L   Chloride 98 96 - 106 mmol/L   CO2 23 20 - 29 mmol/L   Calcium 9.7 8.7 - 10.3 mg/dL   Total Protein 7.2 6.0 - 8.5 g/dL   Albumin 4.3 3.8 - 4.8 g/dL   Globulin, Total 2.9 1.5 - 4.5 g/dL   Bilirubin Total 1.6 (H) 0.0 - 1.2 mg/dL   Alkaline Phosphatase 82 49 - 135 IU/L   AST 20 0 - 40 IU/L   ALT 18 0 - 32 IU/L  TSH + free T4     Status: None   Collection Time: 04/19/24  7:08 AM  Result Value Ref Range   TSH 3.680 0.450 - 4.500 uIU/mL   Free T4 1.07 0.82 - 1.77 ng/dL  Lipid Panel With LDL/HDL Ratio     Status: Abnormal   Collection Time: 04/19/24  7:08 AM  Result Value Ref Range   Cholesterol, Total 180 100 - 199 mg/dL   Triglycerides 804 (H) 0 - 149 mg/dL   HDL 40 >60 mg/dL   VLDL Cholesterol Cal 34 5 - 40 mg/dL   LDL Chol Calc (NIH) 893 (H) 0 - 99 mg/dL   LDL/HDL Ratio 2.7 0.0 - 3.2 ratio    Comment:                                     LDL/HDL Ratio  Men  Women                               1/2 Avg.Risk  1.0    1.5                                   Avg.Risk  3.6    3.2                                2X Avg.Risk  6.2    5.0                                3X Avg.Risk  8.0    6.1   Hgb A1C w/o eAG     Status: Abnormal   Collection Time: 04/19/24  7:08 AM  Result Value Ref Range   Hgb A1c MFr Bld 6.7 (H) 4.8 - 5.6 %    Comment:          Prediabetes: 5.7 - 6.4          Diabetes: >6.4          Glycemic control for adults with diabetes: <7.0         Assessment/Plan: 1. Encounter for general adult medical examination with abnormal findings (Primary) CPE performed, labs ordered, cologuard UTD, due for vaccines  2. Essential hypertension Stable, continue current medication - carvedilol  (COREG ) 12.5 MG tablet; Take 1 tablet (12.5 mg total) by mouth 2 (two) times daily.  Dispense: 180 tablet; Refill: 3  3. Spleen enlargement Declines further evaluation  at this time  4. Fatty liver Declines further evaluation at this time, but will check routine labs  5. Prediabetes Will update labs, potentially interested in ozempic  if A1c has increased on labs - Hgb A1C w/o eAG  6. Need for pneumococcal vaccine - pneumococcal 20-valent conjugate vaccine (PREVNAR 20 ) 0.5 ML injection; Inject 0.5 mLs into the muscle tomorrow at 10 am for 1 dose.  Dispense: 0.5 mL; Refill: 0  7. Need for shingles vaccine - Zoster Vaccine Adjuvanted (SHINGRIX ) injection; Inject 0.5 mLs into the muscle once for 1 dose.  Dispense: 0.5 mL; Refill: 0  8. Need for Tdap vaccination - Tdap (BOOSTRIX) 5-2.5-18.5 LF-MCG/0.5 injection; Inject 0.5 mLs into the muscle once for 1 dose.  Dispense: 0.5 mL; Refill: 0  9. Thyroid disorder screen - TSH + free T4  10. Mixed hyperlipidemia - Lipid Panel With LDL/HDL Ratio  11. Other fatigue - CBC w/Diff/Platelet - Comprehensive metabolic panel with GFR - TSH + free T4 - Lipid Panel With LDL/HDL Ratio - Hgb A1C w/o eAG  12. Dysuria - UA/M w/rflx Culture, Routine  13. Morbid obesity with BMI of 40.0-44.9, adult (HCC) Continue to work on diet and exercise   General Counseling: Stephanieann verbalizes understanding of the findings of todays visit and agrees with plan of treatment. I have discussed any further diagnostic evaluation that may be needed or ordered today. We also reviewed her medications today. she has been encouraged to call the office with any questions or concerns that should arise related to todays visit.    Counseling:    Orders Placed This Encounter  Procedures   Microscopic Examination   Urine Culture, Reflex   UA/M w/rflx Culture,  Routine   CBC w/Diff/Platelet   Comprehensive metabolic panel with GFR   TSH + free T4   Lipid Panel With LDL/HDL Ratio   Hgb A1C w/o eAG    Meds ordered this encounter  Medications   pneumococcal 20-valent conjugate vaccine (PREVNAR 20 ) 0.5 ML injection    Sig: Inject 0.5  mLs into the muscle tomorrow at 10 am for 1 dose.    Dispense:  0.5 mL    Refill:  0   Tdap (BOOSTRIX) 5-2.5-18.5 LF-MCG/0.5 injection    Sig: Inject 0.5 mLs into the muscle once for 1 dose.    Dispense:  0.5 mL    Refill:  0   Zoster Vaccine Adjuvanted (SHINGRIX ) injection    Sig: Inject 0.5 mLs into the muscle once for 1 dose.    Dispense:  0.5 mL    Refill:  0   carvedilol  (COREG ) 12.5 MG tablet    Sig: Take 1 tablet (12.5 mg total) by mouth 2 (two) times daily.    Dispense:  180 tablet    Refill:  3    This patient was seen by Tinnie Pro, PA-C in collaboration with Dr. Sigrid Bathe as a part of collaborative care agreement.  Total time spent:35 Minutes  Time spent includes review of chart, medications, test results, and follow up plan with the patient.     Sigrid CHRISTELLA Bathe, MD  Internal Medicine

## 2024-04-17 LAB — UA/M W/RFLX CULTURE, ROUTINE
Bilirubin, UA: NEGATIVE
Ketones, UA: NEGATIVE
Nitrite, UA: NEGATIVE
Protein,UA: NEGATIVE
RBC, UA: NEGATIVE
Specific Gravity, UA: 1.018 (ref 1.005–1.030)
Urobilinogen, Ur: 0.2 mg/dL (ref 0.2–1.0)
pH, UA: 5.5 (ref 5.0–7.5)

## 2024-04-17 LAB — URINE CULTURE, REFLEX

## 2024-04-17 LAB — MICROSCOPIC EXAMINATION
Bacteria, UA: NONE SEEN
Casts: NONE SEEN /LPF
WBC, UA: >30 /HPF — AB (ref 0–5)

## 2024-04-20 ENCOUNTER — Ambulatory Visit: Payer: Self-pay | Admitting: Physician Assistant

## 2024-04-20 LAB — CBC WITH DIFFERENTIAL/PLATELET
Basophils Absolute: 0 x10E3/uL (ref 0.0–0.2)
Basos: 0 %
EOS (ABSOLUTE): 0.1 x10E3/uL (ref 0.0–0.4)
Eos: 2 %
Hematocrit: 36.5 % (ref 34.0–46.6)
Hemoglobin: 10.9 g/dL — ABNORMAL LOW (ref 11.1–15.9)
Immature Grans (Abs): 0 x10E3/uL (ref 0.0–0.1)
Immature Granulocytes: 0 %
Lymphocytes Absolute: 3 x10E3/uL (ref 0.7–3.1)
Lymphs: 36 %
MCH: 19.1 pg — ABNORMAL LOW (ref 26.6–33.0)
MCHC: 29.9 g/dL — ABNORMAL LOW (ref 31.5–35.7)
MCV: 64 fL — ABNORMAL LOW (ref 79–97)
Monocytes Absolute: 0.7 x10E3/uL (ref 0.1–0.9)
Monocytes: 8 %
Neutrophils Absolute: 4.5 x10E3/uL (ref 1.4–7.0)
Neutrophils: 54 %
Platelets: 239 x10E3/uL (ref 150–450)
RBC: 5.71 x10E6/uL — ABNORMAL HIGH (ref 3.77–5.28)
RDW: 18.4 % — ABNORMAL HIGH (ref 11.7–15.4)
WBC: 8.4 x10E3/uL (ref 3.4–10.8)

## 2024-04-20 LAB — COMPREHENSIVE METABOLIC PANEL WITH GFR
ALT: 18 IU/L (ref 0–32)
AST: 20 IU/L (ref 0–40)
Albumin: 4.3 g/dL (ref 3.8–4.8)
Alkaline Phosphatase: 82 IU/L (ref 49–135)
BUN/Creatinine Ratio: 19 (ref 12–28)
BUN: 14 mg/dL (ref 8–27)
Bilirubin Total: 1.6 mg/dL — ABNORMAL HIGH (ref 0.0–1.2)
CO2: 23 mmol/L (ref 20–29)
Calcium: 9.7 mg/dL (ref 8.7–10.3)
Chloride: 98 mmol/L (ref 96–106)
Creatinine, Ser: 0.73 mg/dL (ref 0.57–1.00)
Globulin, Total: 2.9 g/dL (ref 1.5–4.5)
Glucose: 117 mg/dL — ABNORMAL HIGH (ref 70–99)
Potassium: 4.8 mmol/L (ref 3.5–5.2)
Sodium: 136 mmol/L (ref 134–144)
Total Protein: 7.2 g/dL (ref 6.0–8.5)
eGFR: 86 mL/min/1.73 (ref >59)

## 2024-04-20 LAB — TSH+FREE T4
Free T4: 1.07 ng/dL (ref 0.82–1.77)
TSH: 3.68 u[IU]/mL (ref 0.450–4.500)

## 2024-04-20 LAB — LIPID PANEL WITH LDL/HDL RATIO
Cholesterol, Total: 180 mg/dL (ref 100–199)
HDL: 40 mg/dL (ref >39)
LDL Chol Calc (NIH): 106 mg/dL — ABNORMAL HIGH (ref 0–99)
LDL/HDL Ratio: 2.7 ratio (ref 0.0–3.2)
Triglycerides: 195 mg/dL — ABNORMAL HIGH (ref 0–149)
VLDL Cholesterol Cal: 34 mg/dL (ref 5–40)

## 2024-04-20 LAB — HGB A1C W/O EAG: Hgb A1c MFr Bld: 6.7 % — ABNORMAL HIGH (ref 4.8–5.6)

## 2024-04-21 NOTE — Telephone Encounter (Signed)
 Tried to reach patient by phone, and sent MyChart message regarding labs.

## 2024-04-21 NOTE — Telephone Encounter (Signed)
-----   Message from Tinnie MARLA Pro sent at 04/20/2024  2:07 PM EST ----- Please let her know that her hemoglobin is still low but stable from previous. Bilirubin is further elevated at 1.6 now. These may relate to alpha thalassemia, but given spleen enlargement previously  discussed may want to consider referral for further evaluation of this. Cholesterol rising with LDL 106 and TG 195. A1c now diabetic range at 6.7. We had discussed Farxiga/Jardiance in the past when  prediabetic and I would recommend this again if open to it. ----- Message ----- From: Interface, [facility] Lab Results In Sent: 04/16/2024   7:37 AM EST To: Tinnie MARLA Pro, PA-C

## 2024-04-26 ENCOUNTER — Other Ambulatory Visit: Payer: Self-pay | Admitting: Physician Assistant

## 2024-04-26 ENCOUNTER — Other Ambulatory Visit: Payer: Self-pay

## 2024-04-26 DIAGNOSIS — E119 Type 2 diabetes mellitus without complications: Secondary | ICD-10-CM

## 2024-04-26 MED ORDER — OZEMPIC (0.25 OR 0.5 MG/DOSE) 2 MG/3ML ~~LOC~~ SOPN: 0.2500 mg | PEN_INJECTOR | SUBCUTANEOUS | 0 refills | Status: DC

## 2024-04-26 MED ORDER — ACCU-CHEK SOFTCLIX LANCETS MISC: 100.0000 | Freq: Every day | 3 refills | Status: AC

## 2024-04-26 MED ORDER — ACCU-CHEK GUIDE TEST VI STRP: ORAL_STRIP | 3 refills | Status: AC

## 2024-04-26 MED ORDER — ACCU-CHEK GUIDE ME W/DEVICE KIT: PACK | 0 refills | Status: AC

## 2024-04-27 ENCOUNTER — Telehealth: Payer: Self-pay

## 2024-04-27 NOTE — Telephone Encounter (Signed)
Completed P.A. for patient's Ozempic. 

## 2024-05-21 ENCOUNTER — Other Ambulatory Visit: Payer: Self-pay

## 2024-05-21 DIAGNOSIS — E119 Type 2 diabetes mellitus without complications: Secondary | ICD-10-CM

## 2024-05-21 MED ORDER — OZEMPIC (0.25 OR 0.5 MG/DOSE) 2 MG/3ML ~~LOC~~ SOPN: 0.2500 mg | PEN_INJECTOR | SUBCUTANEOUS | 0 refills | Status: DC

## 2024-06-10 ENCOUNTER — Ambulatory Visit (INDEPENDENT_AMBULATORY_CARE_PROVIDER_SITE_OTHER): Admitting: Physician Assistant

## 2024-06-10 ENCOUNTER — Encounter: Payer: Self-pay | Admitting: Physician Assistant

## 2024-06-10 VITALS — BP 111/65 | HR 78 | Temp 98.0°F | Resp 16 | Ht 63.0 in | Wt 239.0 lb

## 2024-06-10 DIAGNOSIS — Z6841 Body Mass Index (BMI) 40.0 and over, adult: Secondary | ICD-10-CM

## 2024-06-10 DIAGNOSIS — E119 Type 2 diabetes mellitus without complications: Secondary | ICD-10-CM | POA: Diagnosis not present

## 2024-06-10 MED ORDER — OZEMPIC (0.25 OR 0.5 MG/DOSE) 2 MG/3ML ~~LOC~~ SOPN: 0.5000 mg | PEN_INJECTOR | SUBCUTANEOUS | 2 refills | Status: AC

## 2024-06-10 NOTE — Progress Notes (Signed)
 [facility] [address] [place], [place] [address]  Internal MEDICINE  Office Visit Note  Patient Name: Faith Hernandez  [id number]  [id number]  Date of Service: 06/10/2024  Chief Complaint  Patient presents with   Follow-up   Hypertension    HPI Pt is here for routine follow up, her son is with her and assists with translating -doing well with ozempic , a little nausea the day of dose, but then improves and tolerable -states appetite only slightly reduced, would like to increase dose -down 3lbs since last visit  Current Medication: Outpatient Encounter Medications as of 06/10/2024  Medication Sig   Accu-Chek Softclix Lancets lancets 100 each by Other route daily. Use as directed, once daily in AM before first meal. Dx: E11.65.   Blood Glucose Monitoring Suppl (ACCU-CHEK GUIDE ME) w/Device KIT Dx: E11.65. Use as directed.   carvedilol  (COREG ) 12.5 MG tablet Take 1 tablet (12.5 mg total) by mouth 2 (two) times daily.   cholecalciferol (VITAMIN D3) 25 MCG (1000 UNIT) tablet Take 1,000 Units by mouth daily.   cyanocobalamin (VITAMIN B12) 1000 MCG tablet Take 1,000 mcg by mouth daily.   glucose blood (ACCU-CHEK GUIDE TEST) test strip Use as directed, once daily in AM before first meal. Dx: E11.65.   nitroGLYCERIN  (NITROSTAT ) 0.4 MG SL tablet Place 1 tablet (0.4 mg total) under the tongue every 5 (five) minutes as needed.   Semaglutide ,0.25 or 0.5MG /DOS, (OZEMPIC , 0.25 OR 0.5 MG/DOSE,) 2 MG/3ML SOPN Inject 0.5 mg into the skin once a week.   [DISCONTINUED] Semaglutide ,0.25 or 0.5MG /DOS, (OZEMPIC , 0.25 OR 0.5 MG/DOSE,) 2 MG/3ML SOPN Inject 0.25 mg into the skin once a week.   No facility-administered encounter medications on file as of 06/10/2024.    Surgical History: History reviewed. No pertinent surgical history.  Medical History: Past Medical History:  Diagnosis Date   Anemia    Hypertension     Family History: Family History  Problem Relation Age of Onset    Heart attack Brother    Hypertension Brother    Hyperlipidemia Brother     Social History   Socioeconomic History   Marital status: Married    Spouse name: Not on file   Number of children: Not on file   Years of education: Not on file   Highest education level: Not on file  Occupational History   Not on file  Tobacco Use   Smoking status: Never   Smokeless tobacco: Never  Vaping Use   Vaping status: Never Used  Substance and Sexual Activity   Alcohol use: Never   Drug use: Never   Sexual activity: Not on file  Other Topics Concern   Not on file  Social History Narrative   Not on file   Social Drivers of Health   Tobacco Use: Low Risk (06/10/2024)   Patient History    Smoking Tobacco Use: Never    Smokeless Tobacco Use: Never    Passive Exposure: Not on file  Financial Resource Strain: Not on file  Food Insecurity: Not on file  Transportation Needs: Not on file  Physical Activity: Not on file  Stress: Not on file  Social Connections: Not on file  Intimate Partner Violence: Not on file  Depression (EYV7-0): Low Risk (04/15/2024)   Depression (PHQ2-9)    PHQ-2 Score: 0  Alcohol Screen: Not on file  Housing: Not on file  Utilities: Not on file  Health Literacy: Not on file      Review of Systems  Constitutional:  Negative for chills, fatigue and unexpected weight change.  HENT:  Negative for congestion, rhinorrhea, sneezing and sore throat.   Eyes:  Negative for redness.  Respiratory:  Negative for cough, chest tightness and shortness of breath.   Cardiovascular:  Negative for chest pain and palpitations.  Gastrointestinal:  Negative for abdominal pain, blood in stool, constipation, diarrhea, nausea and vomiting.  Genitourinary:  Negative for dysuria and frequency.  Musculoskeletal:  Positive for arthralgias. Negative for back pain, joint swelling and neck pain.  Skin:  Negative for rash.  Neurological: Negative.  Negative for tremors and numbness.   Hematological:  Negative for adenopathy. Does not bruise/bleed easily.  Psychiatric/Behavioral:  Negative for behavioral problems (Depression), sleep disturbance and suicidal ideas. The patient is not nervous/anxious.     Vital Signs: BP 111/65   Pulse 78   Temp 98 F (36.7 C)   Resp 16   Ht 5' 3 (1.6 m)   Wt 239 lb (108.4 kg)   SpO2 98%   BMI 42.34 kg/m    Physical Exam Vitals and nursing note reviewed.  Constitutional:      General: She is not in acute distress.    Appearance: Normal appearance. She is well-developed. She is not diaphoretic.  HENT:     Head: Normocephalic and atraumatic.  Eyes:     Extraocular Movements: Extraocular movements intact.  Neck:     Thyroid: No thyromegaly.     Vascular: No JVD.     Trachea: No tracheal deviation.  Cardiovascular:     Rate and Rhythm: Normal rate and regular rhythm.     Heart sounds: Normal heart sounds. No murmur heard.    No friction rub. No gallop.  Pulmonary:     Effort: Pulmonary effort is normal. No respiratory distress.     Breath sounds: No wheezing or rales.  Chest:     Chest wall: No tenderness.  Breasts:    Right: Normal. No mass.     Left: Normal. No mass.  Musculoskeletal:        General: No tenderness.  Skin:    General: Skin is warm and dry.  Neurological:     Mental Status: She is alert and oriented to person, place, and time.  Psychiatric:        Behavior: Behavior normal.        Thought Content: Thought content normal.        Judgment: Judgment normal.        Assessment/Plan: 1. New onset type 2 diabetes mellitus (HCC) (Primary) Will increase to .5mg  weekly ozempic  dose and continue to work on diet and exercise - Semaglutide ,0.25 or 0.5MG /DOS, (OZEMPIC , 0.25 OR 0.5 MG/DOSE,) 2 MG/3ML SOPN; Inject 0.5 mg into the skin once a week.  Dispense: 3 mL; Refill: 2  2. Morbid obesity with BMI of 40.0-44.9, adult (HCC) Down 3lbs since last visit, will continue ozempic  for BG and wt loss  benefits   General Counseling: Faith Hernandez verbalizes understanding of the findings of todays visit and agrees with plan of treatment. I have discussed any further diagnostic evaluation that may be needed or ordered today. We also reviewed her medications today. she has been encouraged to call the office with any questions or concerns that should arise related to todays visit.    No orders of the defined types were placed in this encounter.   Meds ordered this encounter  Medications   Semaglutide ,0.25 or 0.5MG /DOS, (OZEMPIC , 0.25 OR 0.5 MG/DOSE,) 2 MG/3ML SOPN    Sig:  Inject 0.5 mg into the skin once a week.    Dispense:  3 mL    Refill:  2    This patient was seen by Tinnie Pro, PA-C in collaboration with Dr. Sigrid Bathe as a part of collaborative care agreement.   Total time spent:30 Minutes Time spent includes review of chart, medications, test results, and follow up plan with the patient.      Dr Fozia M Khan Internal medicine

## 2024-09-16 ENCOUNTER — Ambulatory Visit: Admitting: Physician Assistant

## 2024-10-14 ENCOUNTER — Ambulatory Visit: Admitting: Physician Assistant

## 2025-04-18 ENCOUNTER — Encounter: Admitting: Physician Assistant
